# Patient Record
Sex: Male | Born: 1970 | Race: White | Hispanic: No | Marital: Married | Smoking: Current every day smoker
Health system: Southern US, Community
[De-identification: ages and names within clinical notes are randomized; demographics above are authoritative.]

## PROBLEM LIST (undated history)

## (undated) DIAGNOSIS — M5020 Other cervical disc displacement, unspecified cervical region: Secondary | ICD-10-CM

## (undated) HISTORY — PX: NECK SURGERY: SHX720

## (undated) SURGERY — Surgical Case
Anesthesia: *Unknown

---

## 2000-10-26 ENCOUNTER — Emergency Department (HOSPITAL_COMMUNITY): Admission: EM | Admit: 2000-10-26 | Discharge: 2000-10-26 | Payer: Self-pay | Admitting: *Deleted

## 2000-10-27 ENCOUNTER — Emergency Department (HOSPITAL_COMMUNITY): Admission: EM | Admit: 2000-10-27 | Discharge: 2000-10-27 | Payer: Self-pay | Admitting: Internal Medicine

## 2000-10-27 ENCOUNTER — Encounter: Payer: Self-pay | Admitting: Internal Medicine

## 2009-05-22 ENCOUNTER — Emergency Department (HOSPITAL_COMMUNITY): Admission: EM | Admit: 2009-05-22 | Discharge: 2009-05-22 | Payer: Self-pay | Admitting: Emergency Medicine

## 2012-01-23 ENCOUNTER — Inpatient Hospital Stay (HOSPITAL_COMMUNITY)
Admission: EM | Admit: 2012-01-23 | Discharge: 2012-01-29 | DRG: 603 | Disposition: A | Discharge status: Home / Self Care | Payer: Self-pay | Attending: Internal Medicine | Admitting: Internal Medicine

## 2012-01-23 ENCOUNTER — Emergency Department (HOSPITAL_COMMUNITY): Payer: Self-pay

## 2012-01-23 ENCOUNTER — Encounter (HOSPITAL_COMMUNITY): Payer: Self-pay | Admitting: Emergency Medicine

## 2012-01-23 DIAGNOSIS — L0291 Cutaneous abscess, unspecified: Secondary | ICD-10-CM | POA: Diagnosis present

## 2012-01-23 DIAGNOSIS — F17200 Nicotine dependence, unspecified, uncomplicated: Secondary | ICD-10-CM | POA: Diagnosis present

## 2012-01-23 DIAGNOSIS — E669 Obesity, unspecified: Secondary | ICD-10-CM | POA: Diagnosis present

## 2012-01-23 DIAGNOSIS — L03319 Cellulitis of trunk, unspecified: Principal | ICD-10-CM | POA: Diagnosis present

## 2012-01-23 DIAGNOSIS — L0390 Cellulitis, unspecified: Secondary | ICD-10-CM | POA: Diagnosis present

## 2012-01-23 DIAGNOSIS — L03314 Cellulitis of groin: Secondary | ICD-10-CM

## 2012-01-23 DIAGNOSIS — F411 Generalized anxiety disorder: Secondary | ICD-10-CM | POA: Diagnosis present

## 2012-01-23 DIAGNOSIS — G894 Chronic pain syndrome: Secondary | ICD-10-CM | POA: Diagnosis present

## 2012-01-23 DIAGNOSIS — F419 Anxiety disorder, unspecified: Secondary | ICD-10-CM | POA: Diagnosis present

## 2012-01-23 DIAGNOSIS — L02219 Cutaneous abscess of trunk, unspecified: Principal | ICD-10-CM | POA: Diagnosis present

## 2012-01-23 DIAGNOSIS — R52 Pain, unspecified: Secondary | ICD-10-CM | POA: Diagnosis present

## 2012-01-23 LAB — DIFFERENTIAL
Basophils Relative: 0 % (ref 0–1)
Eosinophils Absolute: 0.2 10*3/uL (ref 0.0–0.7)
Lymphs Abs: 1.8 10*3/uL (ref 0.7–4.0)
Neutro Abs: 7 10*3/uL (ref 1.7–7.7)
Neutrophils Relative %: 73 % (ref 43–77)

## 2012-01-23 LAB — CBC
Hemoglobin: 12.2 g/dL — ABNORMAL LOW (ref 13.0–17.0)
MCH: 32.1 pg (ref 26.0–34.0)
Platelets: 256 10*3/uL (ref 150–400)
RBC: 3.8 MIL/uL — ABNORMAL LOW (ref 4.22–5.81)

## 2012-01-23 LAB — URINALYSIS, ROUTINE W REFLEX MICROSCOPIC
Glucose, UA: NEGATIVE mg/dL
Leukocytes, UA: NEGATIVE
Protein, ur: NEGATIVE mg/dL
Specific Gravity, Urine: 1.008 (ref 1.005–1.030)
Urobilinogen, UA: 0.2 mg/dL (ref 0.0–1.0)

## 2012-01-23 LAB — POCT I-STAT, CHEM 8
Hemoglobin: 12.2 g/dL — ABNORMAL LOW (ref 13.0–17.0)
Potassium: 3.9 mEq/L (ref 3.5–5.1)
Sodium: 138 mEq/L (ref 135–145)
TCO2: 27 mmol/L (ref 0–100)

## 2012-01-23 MED ORDER — HYDROMORPHONE HCL PF 1 MG/ML IJ SOLN
1.0000 mg | Freq: Once | INTRAMUSCULAR | Status: AC
  Administered 2012-01-23: 1 mg via INTRAVENOUS
  Filled 2012-01-23: qty 1

## 2012-01-23 MED ORDER — VANCOMYCIN HCL IN DEXTROSE 1-5 GM/200ML-% IV SOLN
1000.0000 mg | Freq: Once | INTRAVENOUS | Status: AC
  Administered 2012-01-23: 1000 mg via INTRAVENOUS
  Filled 2012-01-23: qty 200

## 2012-01-23 MED ORDER — ONDANSETRON HCL 4 MG/2ML IJ SOLN
4.0000 mg | Freq: Once | INTRAMUSCULAR | Status: AC
  Administered 2012-01-23: 4 mg via INTRAVENOUS
  Filled 2012-01-23: qty 2

## 2012-01-23 MED ORDER — SODIUM CHLORIDE 0.9 % IV SOLN
Freq: Once | INTRAVENOUS | Status: AC
  Administered 2012-01-23: 22:00:00 via INTRAVENOUS

## 2012-01-23 NOTE — ED Notes (Signed)
The pt has had pain with swelling to his penis for 2-4 days ... Worse for 2 days

## 2012-01-23 NOTE — ED Notes (Addendum)
Pt c/o pain to groin area, started swelling 2 days ago.  St's area is red and hot.  Pt also st's he has had elevated temp.  Pt also st's he has a hardened area to right testicle.  Pt st's pain is radiating down right leg.

## 2012-01-23 NOTE — ED Notes (Signed)
Pt appears in pain.  Scrotal area red and marked. Vancomycin infiltrated to L hand.  Pharmacy notified and stated to place warm compresses on the site.

## 2012-01-23 NOTE — ED Provider Notes (Signed)
History     CSN: [id number]  Arrival date & time 01/23/12  1925   First MD Initiated Contact with Patient 01/23/12 2058      Chief Complaint  Patient presents with  . Groin Pain    (Consider location/radiation/quality/duration/timing/severity/associated sxs/prior treatment) HPI Comments: Patient presents today with right testicular pain and right groin.)  The pain has been going on for the past 3 days progressing rapidly.  Fever, myalgias, chest pain, chills, no dysuria, or frequency  Patient is a 41 y.o. male presenting with groin pain. The history is provided by the patient.  Groin Pain This is a new problem. The current episode started in the past 7 days. The problem occurs constantly. The problem has been gradually worsening. Associated symptoms include chest pain, chills, a fever, myalgias and weakness. Pertinent negatives include no urinary symptoms.  Groin Pain This is a new problem. The current episode started in the past 7 days. The problem occurs constantly. The problem has been gradually worsening. Associated symptoms include chest pain.    History reviewed. No pertinent past medical history.  Past Surgical History  Procedure Date  . Neck surgery     No family history on file.  History  Substance Use Topics  . Smoking status: Current Everyday Smoker  . Smokeless tobacco: Not on file  . Alcohol Use: Yes     occasional      Review of Systems  Constitutional: Positive for fever and chills.  Cardiovascular: Positive for chest pain.  Genitourinary: Positive for scrotal swelling and testicular pain. Negative for dysuria, frequency, decreased urine volume and penile swelling.  Musculoskeletal: Positive for myalgias.  Neurological: Positive for weakness.    Allergies  Flexeril and Robaxin  Home Medications   Current Outpatient Rx  Name Route Sig Dispense Refill  . GABAPENTIN 300 MG PO CAPS Oral Take 600 mg by mouth 2 (two) times daily.    . MOMETASONE  FUROATE 0.1 % EX CREA Topical Apply 1 application topically 3 (three) times daily.      BP 106/50  Pulse 84  Temp(Src) 98.4 F (36.9 C) (Oral)  Resp 20  SpO2 98%  Physical Exam  Constitutional: He is oriented to person, place, and time. He appears well-developed and well-nourished.  HENT:  Head: Normocephalic.  Cardiovascular: Normal rate.   Pulmonary/Chest: Effort normal.  Abdominal: Soft. Hernia confirmed negative in the right inguinal area.  Genitourinary:    Right testis shows swelling and tenderness. Circumcised.       Area of redness testicle tender   Neurological: He is alert and oriented to person, place, and time.  Skin: There is erythema.  Psychiatric: He has a normal mood and affect.    ED Course  Procedures (including critical care time)   Labs Reviewed  CBC  DIFFERENTIAL  URINALYSIS, ROUTINE W REFLEX MICROSCOPIC   Results for orders placed during the hospital encounter of 01/23/12  CBC      Component Value Range   WBC 9.6  4.0 - 10.5 (K/uL)   RBC 3.80 (*) 4.22 - 5.81 (MIL/uL)   Hemoglobin 12.2 (*) 13.0 - 17.0 (g/dL)   HCT 84.6 (*) 96.2 - 52.0 (%)   MCV 92.4  78.0 - 100.0 (fL)   MCH 32.1  26.0 - 34.0 (pg)   MCHC 34.8  30.0 - 36.0 (g/dL)   RDW 95.2  84.1 - 32.4 (%)   Platelets 256  150 - 400 (K/uL)  DIFFERENTIAL      Component Value Range  Neutrophils Relative 73  43 - 77 (%)   Neutro Abs 7.0  1.7 - 7.7 (K/uL)   Lymphocytes Relative 19  12 - 46 (%)   Lymphs Abs 1.8  0.7 - 4.0 (K/uL)   Monocytes Relative 6  3 - 12 (%)   Monocytes Absolute 0.6  0.1 - 1.0 (K/uL)   Eosinophils Relative 2  0 - 5 (%)   Eosinophils Absolute 0.2  0.0 - 0.7 (K/uL)   Basophils Relative 0  0 - 1 (%)   Basophils Absolute 0.0  0.0 - 0.1 (K/uL)  URINALYSIS, ROUTINE W REFLEX MICROSCOPIC      Component Value Range   Color, Urine YELLOW  YELLOW    APPearance CLEAR  CLEAR    Specific Gravity, Urine 1.008  1.005 - 1.030    pH 6.0  5.0 - 8.0    Glucose, UA NEGATIVE  NEGATIVE  (mg/dL)   Hgb urine dipstick NEGATIVE  NEGATIVE    Bilirubin Urine NEGATIVE  NEGATIVE    Ketones, ur NEGATIVE  NEGATIVE (mg/dL)   Protein, ur NEGATIVE  NEGATIVE (mg/dL)   Urobilinogen, UA 0.2  0.0 - 1.0 (mg/dL)   Nitrite NEGATIVE  NEGATIVE    Leukocytes, UA NEGATIVE  NEGATIVE   POCT I-STAT, CHEM 8      Component Value Range   Sodium 138  135 - 145 (mEq/L)   Potassium 3.9  3.5 - 5.1 (mEq/L)   Chloride 103  96 - 112 (mEq/L)   BUN 14  6 - 23 (mg/dL)   Creatinine, Ser 4.09  0.50 - 1.35 (mg/dL)   Glucose, Bld 99  70 - 99 (mg/dL)   Calcium, Ion 8.11  9.14 - 1.32 (mmol/L)   TCO2 27  0 - 100 (mmol/L)   Hemoglobin 12.2 (*) 13.0 - 17.0 (g/dL)   HCT 78.2 (*) 95.6 - 52.0 (%)   US Scrotum  01/23/2012  *RADIOLOGY REPORT*  Clinical Data: Right groin pain and discoloration for 1 week.  ULTRASOUND OF SCROTUM  Technique:  Complete ultrasound examination of the testicles, epididymis, and other scrotal structures was performed.  Comparison:  None.  Findings:  Right testis:  Measures 4.3 x 2.4 x 2.8 cm.  Normal homogeneous parenchymal echotexture without focal mass lesion.  Flow is demonstrated on color flow Doppler imaging.  Left testis:  Measures 4 x 2.5 x 2.7 cm.  Normal homogeneous parenchymal echotexture without focal mass lesion.  Normal flow is demonstrated on color flow Doppler imaging.  Right epididymis:  Normal in size and appearance.  Left epididymis:  Normal in size and appearance.  Hydrocele:  No evidence of hydrocele.  Varicocele:  No evidence of varicocele.  Lymphadenopathy is noted in the right groin with enlarged lymph node measuring up to 2.4 cm length.  IMPRESSION: Normal appearance of the testes.  Right groin lymphadenopathy.  Original Report Authenticated By: Marlon Pel, M.D.       1. Cellulitis of groin       MDM  Cellulitis versus for Fourneau's  gangrene  Medical screening examination/treatment/procedure(s) were conducted as a shared visit with non-physician  practitioner(s) and myself.  I personally evaluated the patient during the encounter 41 year old man with a cellulitis of the right groin, appx 10 cm in diameter, extending onto the right hemiscrotum, with tender adenopathy.  Rx with IV antibiotics.  Discussed with Dr. Conley Rolls --> admit to [facility], Dr. Benjamine Mola.    Osvaldo Human, M.D.     Arman Filter,  NP 01/23/12 2119  Carleene Cooper III, MD 01/24/12 [phone]

## 2012-01-23 NOTE — ED Notes (Signed)
Attempted to apply ice to area, pt refused

## 2012-01-23 NOTE — ED Notes (Signed)
To us now 

## 2012-01-24 ENCOUNTER — Encounter (HOSPITAL_COMMUNITY): Payer: Self-pay | Admitting: *Deleted

## 2012-01-24 DIAGNOSIS — L0291 Cutaneous abscess, unspecified: Secondary | ICD-10-CM | POA: Diagnosis present

## 2012-01-24 LAB — CBC
HCT: 33.4 % — ABNORMAL LOW (ref 39.0–52.0)
Hemoglobin: 11.4 g/dL — ABNORMAL LOW (ref 13.0–17.0)
MCH: 31.8 pg (ref 26.0–34.0)
MCH: 31.4 pg (ref 26.0–34.0)
MCHC: 34.7 g/dL (ref 30.0–36.0)
MCV: 91.5 fL (ref 78.0–100.0)
MCV: 92.6 fL (ref 78.0–100.0)
RBC: 3.63 MIL/uL — ABNORMAL LOW (ref 4.22–5.81)
RDW: 12.2 % (ref 11.5–15.5)

## 2012-01-24 LAB — CREATININE, SERUM: GFR calc Af Amer: 84 mL/min — ABNORMAL LOW (ref >90)

## 2012-01-24 LAB — BASIC METABOLIC PANEL
BUN: 13 mg/dL (ref 6–23)
CO2: 28 mEq/L (ref 19–32)
Glucose, Bld: 93 mg/dL (ref 70–99)
Potassium: 4.6 mEq/L (ref 3.5–5.1)
Sodium: 138 mEq/L (ref 135–145)

## 2012-01-24 MED ORDER — ONDANSETRON HCL 4 MG PO TABS: 4.0000 mg | ORAL_TABLET | Freq: Four times a day (QID) | ORAL | Status: DC | PRN

## 2012-01-24 MED ORDER — DEXTROSE 5 % IV SOLN
1.0000 g | INTRAVENOUS | Status: DC
  Filled 2012-01-24: qty 1

## 2012-01-24 MED ORDER — ONDANSETRON HCL 4 MG/2ML IJ SOLN: 4.0000 mg | Freq: Four times a day (QID) | INTRAMUSCULAR | Status: DC | PRN

## 2012-01-24 MED ORDER — DEXTROSE-NACL 5-0.9 % IV SOLN
INTRAVENOUS | Status: DC
  Administered 2012-01-24: 125 mL/h via INTRAVENOUS
  Administered 2012-01-24 – 2012-01-29 (×6): via INTRAVENOUS

## 2012-01-24 MED ORDER — VANCOMYCIN HCL IN DEXTROSE 1-5 GM/200ML-% IV SOLN
1000.0000 mg | Freq: Three times a day (TID) | INTRAVENOUS | Status: DC
  Administered 2012-01-24 – 2012-01-29 (×17): 1000 mg via INTRAVENOUS
  Filled 2012-01-24 (×18): qty 200

## 2012-01-24 MED ORDER — ACETAMINOPHEN 650 MG RE SUPP: 650.0000 mg | Freq: Four times a day (QID) | RECTAL | Status: DC | PRN

## 2012-01-24 MED ORDER — GABAPENTIN 300 MG PO CAPS
600.0000 mg | ORAL_CAPSULE | Freq: Two times a day (BID) | ORAL | Status: DC
  Administered 2012-01-24 – 2012-01-29 (×11): 600 mg via ORAL
  Filled 2012-01-24 (×12): qty 2

## 2012-01-24 MED ORDER — HYDROMORPHONE HCL PF 1 MG/ML IJ SOLN
1.0000 mg | INTRAMUSCULAR | Status: DC | PRN
  Administered 2012-01-24 (×2): 1 mg via INTRAVENOUS
  Filled 2012-01-24 (×2): qty 1

## 2012-01-24 MED ORDER — LORAZEPAM 1 MG PO TABS
1.0000 mg | ORAL_TABLET | Freq: Once | ORAL | Status: AC
  Administered 2012-01-24: 1 mg via ORAL
  Filled 2012-01-24: qty 1

## 2012-01-24 MED ORDER — ZOLPIDEM TARTRATE 10 MG PO TABS
10.0000 mg | ORAL_TABLET | Freq: Every evening | ORAL | Status: DC | PRN
  Administered 2012-01-24 – 2012-01-29 (×6): 10 mg via ORAL
  Filled 2012-01-24 (×2): qty 1
  Filled 2012-01-24: qty 2
  Filled 2012-01-24 (×3): qty 1

## 2012-01-24 MED ORDER — HYDROMORPHONE HCL PF 1 MG/ML IJ SOLN
2.0000 mg | INTRAMUSCULAR | Status: DC | PRN
  Administered 2012-01-24 – 2012-01-29 (×39): 2 mg via INTRAVENOUS
  Filled 2012-01-24 (×7): qty 2
  Filled 2012-01-24: qty 1
  Filled 2012-01-24 (×12): qty 2
  Filled 2012-01-24: qty 1
  Filled 2012-01-24 (×4): qty 2
  Filled 2012-01-24: qty 1
  Filled 2012-01-24 (×15): qty 2

## 2012-01-24 MED ORDER — PIPERACILLIN-TAZOBACTAM 3.375 G IVPB
3.3750 g | Freq: Once | INTRAVENOUS | Status: AC
  Administered 2012-01-24: 3.375 g via INTRAVENOUS
  Filled 2012-01-24: qty 50

## 2012-01-24 MED ORDER — KETOROLAC TROMETHAMINE 30 MG/ML IJ SOLN
30.0000 mg | Freq: Once | INTRAMUSCULAR | Status: AC
  Administered 2012-01-24: 30 mg via INTRAVENOUS
  Filled 2012-01-24: qty 1

## 2012-01-24 MED ORDER — ACETAMINOPHEN 325 MG PO TABS
650.0000 mg | ORAL_TABLET | Freq: Four times a day (QID) | ORAL | Status: DC | PRN
  Administered 2012-01-26 – 2012-01-28 (×4): 650 mg via ORAL
  Filled 2012-01-24 (×5): qty 2

## 2012-01-24 MED ORDER — PIPERACILLIN-TAZOBACTAM 3.375 G IVPB
3.3750 g | Freq: Three times a day (TID) | INTRAVENOUS | Status: DC
  Administered 2012-01-24 – 2012-01-29 (×17): 3.375 g via INTRAVENOUS
  Filled 2012-01-24 (×18): qty 50

## 2012-01-24 MED ORDER — HEPARIN SODIUM (PORCINE) 5000 UNIT/ML IJ SOLN
5000.0000 [IU] | Freq: Three times a day (TID) | INTRAMUSCULAR | Status: DC
  Administered 2012-01-24 – 2012-01-29 (×14): 5000 [IU] via SUBCUTANEOUS
  Filled 2012-01-24 (×19): qty 1

## 2012-01-24 MED ORDER — HYDROMORPHONE HCL PF 1 MG/ML IJ SOLN
1.0000 mg | Freq: Once | INTRAMUSCULAR | Status: AC
  Administered 2012-01-24: 1 mg via INTRAVENOUS
  Filled 2012-01-24: qty 1

## 2012-01-24 MED ORDER — DOCUSATE SODIUM 100 MG PO CAPS
100.0000 mg | ORAL_CAPSULE | Freq: Two times a day (BID) | ORAL | Status: DC
  Administered 2012-01-24 – 2012-01-29 (×11): 100 mg via ORAL
  Filled 2012-01-24 (×13): qty 1

## 2012-01-24 NOTE — Progress Notes (Signed)
Subjective: Patient c/o severe pain when he awoke from sleep. Thinks the swelling is slightly better.  + subjective fevers . States pain medicines are not working  Objective: Vital signs in last 24 hours: Filed Vitals:   01/24/12 0237 01/24/12 0309 01/24/12 0338 01/24/12 0548  BP: 134/64 107/25 115/64 127/81  Pulse: 75 84  63  Temp:  98.3 F (36.8 C)  98.2 F (36.8 C)  TempSrc:   Oral Oral  Resp: 18 18  18   Height:      Weight:   70.308 kg (155 lb)   SpO2: 99% 97%  93%   Weight change:  No intake or output data in the 24 hours ending 01/24/12 1055  Physical Exam: General: Awake, Oriented, No acute distress. HEENT: EOMI. Neck: Supple, no JVD CV: S1 and S2, RRR Lungs: Clear to ascultation bilaterally, no wheezing Abdomen: Soft, Nontender, Nondistended, +bowel sounds. Ext: Good pulses. Trace edema. Skin: erythema smaller in area that what was marked yesterday, R scrotum still swollen and tender, firm area in outer quadrant   Lab Results:  Basename 01/24/12 0600 01/24/12 0154 01/23/12 2130  NA 138 -- 138  K 4.6 -- 3.9  CL 105 -- 103  CO2 28 -- --  GLUCOSE 93 -- 99  BUN 13 -- 14  CREATININE 1.31 1.24 --  CALCIUM 9.0 -- --  MG -- -- --  PHOS -- -- --      Basename 01/24/12 0600 01/24/12 0154 01/23/12 2114  WBC 6.2 7.8 --  NEUTROABS -- -- 7.0  HGB 11.4* 11.6* --  HCT 33.6* 33.4* --  MCV 92.6 91.5 --  PLT 210 223 --          Micro Results: No results found for this or any previous visit (from the past 240 hour(s)).  Studies/Results: US Scrotum  01/23/2012  *RADIOLOGY REPORT*  Clinical Data: Right groin pain and discoloration for 1 week.  ULTRASOUND OF SCROTUM  Technique:  Complete ultrasound examination of the testicles, epididymis, and other scrotal structures was performed.  Comparison:  None.  Findings:  Right testis:  Measures 4.3 x 2.4 x 2.8 cm.  Normal homogeneous parenchymal echotexture without focal mass lesion.  Flow is demonstrated on color flow  Doppler imaging.  Left testis:  Measures 4 x 2.5 x 2.7 cm.  Normal homogeneous parenchymal echotexture without focal mass lesion.  Normal flow is demonstrated on color flow Doppler imaging.  Right epididymis:  Normal in size and appearance.  Left epididymis:  Normal in size and appearance.  Hydrocele:  No evidence of hydrocele.  Varicocele:  No evidence of varicocele.  Lymphadenopathy is noted in the right groin with enlarged lymph node measuring up to 2.4 cm length.  IMPRESSION: Normal appearance of the testes.  Right groin lymphadenopathy.  Original Report Authenticated By: Marlon Pel, M.D.    Medications: I have reviewed the patient's current medications. Scheduled Meds:   . sodium chloride   Intravenous Once  . docusate sodium  100 mg Oral BID  . gabapentin  600 mg Oral BID  . heparin  5,000 Units Subcutaneous Q8H  .  HYDROmorphone (DILAUDID) injection  1 mg Intravenous Once  .  HYDROmorphone (DILAUDID) injection  1 mg Intravenous Once  .  HYDROmorphone (DILAUDID) injection  1 mg Intravenous Once  . ketorolac  30 mg Intravenous Once  . LORazepam  1 mg Oral Once  . ondansetron  4 mg Intravenous Once  . ondansetron  4 mg Intravenous Once  . piperacillin-tazobactam (  ZOSYN)  IV  3.375 g Intravenous Once  . piperacillin-tazobactam (ZOSYN)  IV  3.375 g Intravenous Q8H  . vancomycin  1,000 mg Intravenous Once  . vancomycin  1,000 mg Intravenous Q8H  . DISCONTD: cefTAZidime (FORTAZ)  IV  1 g Intravenous To Major   Continuous Infusions:   . dextrose 5 % and 0.9% NaCl 125 mL/hr (01/24/12 0245)   PRN Meds:.acetaminophen, acetaminophen, HYDROmorphone, ondansetron (ZOFRAN) IV, ondansetron, zolpidem  Assessment/Plan: 1. R groin and scrotum Cellulitis with possible abscess- area is decreased with antibiotics, vanc/zosyn, consulted Dr. Brunilda Payor with Urology (11AM), no WBC count, ? I&D- no abscess on U/S 2. Pain- adjusted dilaudid 3. Heparin/colase    LOS: 1 day  VANN, JESSICA,  DO 01/24/2012, 10:55 AM

## 2012-01-24 NOTE — ED Notes (Signed)
Warm compress placed on L hand.

## 2012-01-24 NOTE — Progress Notes (Signed)
ANTIBIOTIC CONSULT NOTE - INITIAL  Pharmacy Consult for Vancomycin and Zosyn Indication: cellulitis  Allergies  Allergen Reactions  . Flexeril (Cyclobenzaprine Hcl) Rash    "lethargy"  . Robaxin (Methocarbamol) Rash    "lethargy"    Patient Measurements: Height: 5\' 10"  (177.8 cm) Weight: 160 lb (72.576 kg) IBW/kg (Calculated) : 73   Vital Signs: Temp: 98.4 F (36.9 C) (02/08 2324) Temp src: Oral (02/08 2324) BP: 149/107 mmHg (02/09 0030) Pulse Rate: 92  (02/09 0030)  Labs:  Basename 01/23/12 2130 01/23/12 2114  WBC -- 9.6  HGB 12.2* 12.2*  PLT -- 256  LABCREA -- --  CREATININE 1.20 --   Estimated Creatinine Clearance: 85.7 ml/min (by C-G formula based on Cr of 1.2).    Medical History: History reviewed. No pertinent past medical history.  Medications:  Neurontin  Elocon  Assessment: 41 yo male with right groin cellulitis for empiric antibiotics.  Vancomycin 1 g IV given in ED at 10 pm 2/8  Goal of Therapy:  Vancomycin trough level 10-15 mcg/ml  Plan:  Vancomycin 1 g IV q8h Zosyn 3.375 g IV q8h  Eddie Candle 01/24/2012,2:00 AM

## 2012-01-24 NOTE — Consult Note (Signed)
Urology Consult  Referring physician: Dr Marlin Canary Reason for referral: Pain right groin  Chief Complaint: Pain right groin  History of Present Illness: Mr Albert Evans was seen in the ER last night for a 1 week history of right groin pain and swelling.  For the past 3 days the pain gradually increased in intensity.  He has swelling and tenderness of the groin.  He does not have testicular pain.  He has hesitancy.  Urine is clear.  He does not have any changes in his bowel habits.He is on IV Zosyn and there is some improvement of his symptoms.   History reviewed. No pertinent past medical history. Past Surgical History  Procedure Date  . Neck surgery     Medications:Gabapentin, Elocon. Allergies:  Allergies  Allergen Reactions  . Fish-Derived Products Rash  . Flexeril (Cyclobenzaprine Hcl) Rash    "lethargy"  . Robaxin (Methocarbamol) Rash    "lethargy"    No family history on file. Social History:  reports that he has been smoking Cigarettes.  He has smoked for the past 5 years. He does not have any smokeless tobacco history on file. He reports that he drinks alcohol. He reports that he does not use illicit drugs.  ROS: All systems are reviewed and negative except as noted.   Physical Exam:  Vital signs in last 24 hours: Temp:  [98.2 F (36.8 C)-98.4 F (36.9 C)] 98.2 F (36.8 C) (02/09 0548) Pulse Rate:  [63-96] 63  (02/09 0548) Resp:  [18-20] 18  (02/09 0548) BP: (106-149)/(25-107) 127/81 mmHg (02/09 0548) SpO2:  [93 %-99 %] 93 % (02/09 0548) Weight:  [155 lb (70.308 kg)-160 lb (72.576 kg)] 155 lb (70.308 kg) (02/09 1610)  Cardiovascular: Skin warm; not flushed Respiratory: Breaths quiet; no shortness of breath Abdomen: No masses Neurological: Normal sensation to touch Musculoskeletal: Normal motor function arms and legs Lymphatics: No inguinal adenopathy Skin: No rashes Genitourinary:Penis is circumcised.  Meatus is normal.  The testicles are normal.  There is no  scrotal tenderness.  The right groin is reddened, indurated  and tender.  There is no fluctuation.  Laboratory Data:  Results for orders placed during the hospital encounter of 01/23/12 (from the past 72 hour(s))  CBC     Status: Abnormal   Collection Time   01/23/12  9:14 PM      Component Value Range Comment   WBC 9.6  4.0 - 10.5 (K/uL)    RBC 3.80 (*) 4.22 - 5.81 (MIL/uL)    Hemoglobin 12.2 (*) 13.0 - 17.0 (g/dL)    HCT 96.0 (*) 45.4 - 52.0 (%)    MCV 92.4  78.0 - 100.0 (fL)    MCH 32.1  26.0 - 34.0 (pg)    MCHC 34.8  30.0 - 36.0 (g/dL)    RDW 09.8  11.9 - 14.7 (%)    Platelets 256  150 - 400 (K/uL)   DIFFERENTIAL     Status: Normal   Collection Time   01/23/12  9:14 PM      Component Value Range Comment   Neutrophils Relative 73  43 - 77 (%)    Neutro Abs 7.0  1.7 - 7.7 (K/uL)    Lymphocytes Relative 19  12 - 46 (%)    Lymphs Abs 1.8  0.7 - 4.0 (K/uL)    Monocytes Relative 6  3 - 12 (%)    Monocytes Absolute 0.6  0.1 - 1.0 (K/uL)    Eosinophils Relative 2  0 - 5 (%)  Eosinophils Absolute 0.2  0.0 - 0.7 (K/uL)    Basophils Relative 0  0 - 1 (%)    Basophils Absolute 0.0  0.0 - 0.1 (K/uL)   POCT I-STAT, CHEM 8     Status: Abnormal   Collection Time   01/23/12  9:30 PM      Component Value Range Comment   Sodium 138  135 - 145 (mEq/L)    Potassium 3.9  3.5 - 5.1 (mEq/L)    Chloride 103  96 - 112 (mEq/L)    BUN 14  6 - 23 (mg/dL)    Creatinine, Ser 8.65  0.50 - 1.35 (mg/dL)    Glucose, Bld 99  70 - 99 (mg/dL)    Calcium, Ion 7.84  1.12 - 1.32 (mmol/L)    TCO2 27  0 - 100 (mmol/L)    Hemoglobin 12.2 (*) 13.0 - 17.0 (g/dL)    HCT 69.6 (*) 29.5 - 52.0 (%)   URINALYSIS, ROUTINE W REFLEX MICROSCOPIC     Status: Normal   Collection Time   01/23/12 10:02 PM      Component Value Range Comment   Color, Urine YELLOW  YELLOW     APPearance CLEAR  CLEAR     Specific Gravity, Urine 1.008  1.005 - 1.030     pH 6.0  5.0 - 8.0     Glucose, UA NEGATIVE  NEGATIVE (mg/dL)    Hgb urine  dipstick NEGATIVE  NEGATIVE     Bilirubin Urine NEGATIVE  NEGATIVE     Ketones, ur NEGATIVE  NEGATIVE (mg/dL)    Protein, ur NEGATIVE  NEGATIVE (mg/dL)    Urobilinogen, UA 0.2  0.0 - 1.0 (mg/dL)    Nitrite NEGATIVE  NEGATIVE     Leukocytes, UA NEGATIVE  NEGATIVE  MICROSCOPIC NOT DONE ON URINES WITH NEGATIVE PROTEIN, BLOOD, LEUKOCYTES, NITRITE, OR GLUCOSE <1000 mg/dL.  CBC     Status: Abnormal   Collection Time   01/24/12  1:54 AM      Component Value Range Comment   WBC 7.8  4.0 - 10.5 (K/uL)    RBC 3.65 (*) 4.22 - 5.81 (MIL/uL)    Hemoglobin 11.6 (*) 13.0 - 17.0 (g/dL)    HCT 28.4 (*) 13.2 - 52.0 (%)    MCV 91.5  78.0 - 100.0 (fL)    MCH 31.8  26.0 - 34.0 (pg)    MCHC 34.7  30.0 - 36.0 (g/dL)    RDW 44.0  10.2 - 72.5 (%)    Platelets 223  150 - 400 (K/uL)   CREATININE, SERUM     Status: Abnormal   Collection Time   01/24/12  1:54 AM      Component Value Range Comment   Creatinine, Ser 1.24  0.50 - 1.35 (mg/dL)    GFR calc non Af Amer 72 (*) >90 (mL/min)    GFR calc Af Amer 84 (*) >90 (mL/min)   BASIC METABOLIC PANEL     Status: Abnormal   Collection Time   01/24/12  6:00 AM      Component Value Range Comment   Sodium 138  135 - 145 (mEq/L)    Potassium 4.6  3.5 - 5.1 (mEq/L)    Chloride 105  96 - 112 (mEq/L)    CO2 28  19 - 32 (mEq/L)    Glucose, Bld 93  70 - 99 (mg/dL)    BUN 13  6 - 23 (mg/dL)    Creatinine, Ser 3.66  0.50 - 1.35 (  mg/dL)    Calcium 9.0  8.4 - 10.5 (mg/dL)    GFR calc non Af Amer 68 (*) >90 (mL/min)    GFR calc Af Amer 78 (*) >90 (mL/min)   CBC     Status: Abnormal   Collection Time   01/24/12  6:00 AM      Component Value Range Comment   WBC 6.2  4.0 - 10.5 (K/uL)    RBC 3.63 (*) 4.22 - 5.81 (MIL/uL)    Hemoglobin 11.4 (*) 13.0 - 17.0 (g/dL)    HCT 16.1 (*) 09.6 - 52.0 (%)    MCV 92.6  78.0 - 100.0 (fL)    MCH 31.4  26.0 - 34.0 (pg)    MCHC 33.9  30.0 - 36.0 (g/dL)    RDW 04.5  40.9 - 81.1 (%)    Platelets 210  150 - 400 (K/uL)    No results found  for this or any previous visit (from the past 240 hour(s)). Creatinine:  Basename 01/24/12 0600 01/24/12 0154 01/23/12 2130  CREATININE 1.31 1.24 1.20    Xrays:  Scrotal ultrasound revealed normal testicles, right inguinal adenopathy with enlarged lymph node.     Impression/Assessment:  Cellulitis right groin  Plan:  Continue IV Zosyn.  Monitor for development of abscess. If there is fluctuation he would then need incision and drainage.  Tyshana Nishida-HENRY 01/24/2012, 12:48 PM

## 2012-01-24 NOTE — H&P (Signed)
PCP:   No primary provider on file.   Chief Complaint: Groin infection   HPI: Albert Evans is an 41 y.o. male with history of neck pain, status post prior neck surgery, on chronic pain medication, presents to the emergency room with erythema, swelling, and pain of his right groin and right upper thigh. This has been going on for a week. In the past he had similar symptoms and had an I&D of this area. He denied any fever or chills, nausea vomiting, dysuria, or any abdominal pain. Evaluation in the emergency room included a normal white count, normal renal function, and a normal urinalysis. Testicle ultrasound showed no focal abscess, but there is lymphadenopathy. Hospitalist was asked to admit patient for IV antibiotics. He constantly requesting more pain medication.  Rewiew of Systems:  The patient denies anorexia, fever, weight loss,, vision loss, decreased hearing, hoarseness, chest pain, syncope, dyspnea on exertion, peripheral edema, balance deficits, hemoptysis, abdominal pain, melena, hematochezia, severe indigestion/heartburn, hematuria, incontinence, genital sores, muscle weakness, suspicious skin lesions, transient blindness, difficulty walking, depression, unusual weight change, abnormal bleeding, enlarged lymph nodes, angioedema, and breast masses.    History reviewed. No pertinent past medical history.  Past Surgical History  Procedure Date  . Neck surgery     Medications:  HOME MEDS: Prior to Admission medications   Medication Sig Start Date End Date Taking? Authorizing Provider  gabapentin (NEURONTIN) 300 MG capsule Take 600 mg by mouth 2 (two) times daily.   Yes Historical Provider, MD  mometasone (ELOCON) 0.1 % cream Apply 1 application topically 3 (three) times daily.   Yes Historical Provider, MD     Allergies:  Allergies  Allergen Reactions  . Fish-Derived Products Rash  . Flexeril (Cyclobenzaprine Hcl) Rash    "lethargy"  . Robaxin (Methocarbamol) Rash   "lethargy"    Social History:   reports that he has been smoking Cigarettes.  He has smoked for the past 5 years. He does not have any smokeless tobacco history on file. He reports that he drinks alcohol. He reports that he does not use illicit drugs.  Family History: No family history on file.   Physical Exam: Filed Vitals:   01/24/12 0202 01/24/12 0237 01/24/12 0309 01/24/12 0338  BP:  134/64 107/25 115/64  Pulse:  75 84   Temp:   98.3 F (36.8 C)   TempSrc:    Oral  Resp:  18 18   Height: 5\' 10"  (1.778 m)     Weight: 72.576 kg (160 lb)   70.308 kg (155 lb)  SpO2:  99% 97%    Blood pressure 115/64, pulse 84, temperature 98.3 F (36.8 C), temperature source Oral, resp. rate 18, height 5\' 10"  (1.778 m), weight 70.308 kg (155 lb), SpO2 97.00%.  GEN:  person lying in the stretcher in no acute distress; cooperative with exam. PSYCH:  alert and oriented x4; does not appear anxious does not appear depressed; affect is normal HEENT: Mucous membranes pink and anicteric; PERRLA; EOM intact; no cervical lymphadenopathy nor thyromegaly or carotid bruit; no JVD; there is anterior approach surgical scar from prior neck surgery Breasts:: Not examined CHEST WALL: No tenderness CHEST: Normal respiration, clear to auscultation bilaterally HEART: Regular rate and rhythm; no murmurs rubs or gallops BACK: No kyphosis or scoliosis; no CVA tenderness ABDOMEN: Obese, soft non-tender; no masses, no organomegaly, normal abdominal bowel sounds; no pannus; no intertriginous candida. Rectal Exam: Not done EXTREMITIES: No bone or joint deformity; age-appropriate arthropathy of the hands and  knees; no edema; no ulcerations. Genitalia: There is swelling of the right scrotal, with erythema spreading toward the right upper thigh and groin. There is in duration and tenderness along the inguinal canal. Because of scrotum is so tender, I was not able to examine his testicle itself. PULSES: 2+ and symmetric SKIN:  Normal hydration no rash or ulceration CNS: Cranial nerves 2-12 grossly intact no focal neurologic deficit   Labs & Imaging Results for orders placed during the hospital encounter of 01/23/12 (from the past 48 hour(s))  CBC     Status: Abnormal   Collection Time   01/23/12  9:14 PM      Component Value Range Comment   WBC 9.6  4.0 - 10.5 (K/uL)    RBC 3.80 (*) 4.22 - 5.81 (MIL/uL)    Hemoglobin 12.2 (*) 13.0 - 17.0 (g/dL)    HCT 16.1 (*) 09.6 - 52.0 (%)    MCV 92.4  78.0 - 100.0 (fL)    MCH 32.1  26.0 - 34.0 (pg)    MCHC 34.8  30.0 - 36.0 (g/dL)    RDW 04.5  40.9 - 81.1 (%)    Platelets 256  150 - 400 (K/uL)   DIFFERENTIAL     Status: Normal   Collection Time   01/23/12  9:14 PM      Component Value Range Comment   Neutrophils Relative 73  43 - 77 (%)    Neutro Abs 7.0  1.7 - 7.7 (K/uL)    Lymphocytes Relative 19  12 - 46 (%)    Lymphs Abs 1.8  0.7 - 4.0 (K/uL)    Monocytes Relative 6  3 - 12 (%)    Monocytes Absolute 0.6  0.1 - 1.0 (K/uL)    Eosinophils Relative 2  0 - 5 (%)    Eosinophils Absolute 0.2  0.0 - 0.7 (K/uL)    Basophils Relative 0  0 - 1 (%)    Basophils Absolute 0.0  0.0 - 0.1 (K/uL)   POCT I-STAT, CHEM 8     Status: Abnormal   Collection Time   01/23/12  9:30 PM      Component Value Range Comment   Sodium 138  135 - 145 (mEq/L)    Potassium 3.9  3.5 - 5.1 (mEq/L)    Chloride 103  96 - 112 (mEq/L)    BUN 14  6 - 23 (mg/dL)    Creatinine, Ser 9.14  0.50 - 1.35 (mg/dL)    Glucose, Bld 99  70 - 99 (mg/dL)    Calcium, Ion 7.82  1.12 - 1.32 (mmol/L)    TCO2 27  0 - 100 (mmol/L)    Hemoglobin 12.2 (*) 13.0 - 17.0 (g/dL)    HCT 95.6 (*) 21.3 - 52.0 (%)   URINALYSIS, ROUTINE W REFLEX MICROSCOPIC     Status: Normal   Collection Time   01/23/12 10:02 PM      Component Value Range Comment   Color, Urine YELLOW  YELLOW     APPearance CLEAR  CLEAR     Specific Gravity, Urine 1.008  1.005 - 1.030     pH 6.0  5.0 - 8.0     Glucose, UA NEGATIVE  NEGATIVE (mg/dL)    Hgb  urine dipstick NEGATIVE  NEGATIVE     Bilirubin Urine NEGATIVE  NEGATIVE     Ketones, ur NEGATIVE  NEGATIVE (mg/dL)    Protein, ur NEGATIVE  NEGATIVE (mg/dL)    Urobilinogen, UA 0.2  0.0 -  1.0 (mg/dL)    Nitrite NEGATIVE  NEGATIVE     Leukocytes, UA NEGATIVE  NEGATIVE  MICROSCOPIC NOT DONE ON URINES WITH NEGATIVE PROTEIN, BLOOD, LEUKOCYTES, NITRITE, OR GLUCOSE <1000 mg/dL.  CBC     Status: Abnormal   Collection Time   01/24/12  1:54 AM      Component Value Range Comment   WBC 7.8  4.0 - 10.5 (K/uL)    RBC 3.65 (*) 4.22 - 5.81 (MIL/uL)    Hemoglobin 11.6 (*) 13.0 - 17.0 (g/dL)    HCT 29.5 (*) 62.1 - 52.0 (%)    MCV 91.5  78.0 - 100.0 (fL)    MCH 31.8  26.0 - 34.0 (pg)    MCHC 34.7  30.0 - 36.0 (g/dL)    RDW 30.8  65.7 - 84.6 (%)    Platelets 223  150 - 400 (K/uL)   CREATININE, SERUM     Status: Abnormal   Collection Time   01/24/12  1:54 AM      Component Value Range Comment   Creatinine, Ser 1.24  0.50 - 1.35 (mg/dL)    GFR calc non Af Amer 72 (*) >90 (mL/min)    GFR calc Af Amer 84 (*) >90 (mL/min)    US Scrotum  01/23/2012  *RADIOLOGY REPORT*  Clinical Data: Right groin pain and discoloration for 1 week.  ULTRASOUND OF SCROTUM  Technique:  Complete ultrasound examination of the testicles, epididymis, and other scrotal structures was performed.  Comparison:  None.  Findings:  Right testis:  Measures 4.3 x 2.4 x 2.8 cm.  Normal homogeneous parenchymal echotexture without focal mass lesion.  Flow is demonstrated on color flow Doppler imaging.  Left testis:  Measures 4 x 2.5 x 2.7 cm.  Normal homogeneous parenchymal echotexture without focal mass lesion.  Normal flow is demonstrated on color flow Doppler imaging.  Right epididymis:  Normal in size and appearance.  Left epididymis:  Normal in size and appearance.  Hydrocele:  No evidence of hydrocele.  Varicocele:  No evidence of varicocele.  Lymphadenopathy is noted in the right groin with enlarged lymph node measuring up to 2.4 cm length.   IMPRESSION: Normal appearance of the testes.  Right groin lymphadenopathy.  Original Report Authenticated By: Marlon Pel, M.D.      Assessment Present on Admission:  .Cellulitis and abscess Chronic pain syndrome  PLAN:  Will start him on vancomycin and Zosyn. We'll give him pain medication along with IV fluid. I will make him n.p.o. in case he needs I&D. Please consult urologist tomorrow. He is otherwise stable, full code, and will be admitted to [facility] hospitalist service.   Other plans as per orders.    Vora Clover 01/24/2012, 5:39 AM

## 2012-01-24 NOTE — ED Notes (Signed)
PT appears anxious and agitated.  Denies any illicit drug use.  States he smokes only 4 cigarettes a day and does not want a nicotine patch.  Pt requesting to speak to Nurse Practitioner.

## 2012-01-25 ENCOUNTER — Inpatient Hospital Stay (HOSPITAL_COMMUNITY): Payer: Self-pay | Admitting: Certified Registered Nurse Anesthetist

## 2012-01-25 ENCOUNTER — Encounter (HOSPITAL_COMMUNITY)
Admission: EM | Disposition: A | Discharge status: Home / Self Care | Payer: Self-pay | Source: Home / Self Care | Attending: Internal Medicine

## 2012-01-25 ENCOUNTER — Encounter (HOSPITAL_COMMUNITY): Payer: Self-pay | Admitting: Certified Registered Nurse Anesthetist

## 2012-01-25 HISTORY — PX: IRRIGATION AND DEBRIDEMENT ABSCESS: SHX5252

## 2012-01-25 SURGERY — IRRIGATION AND DEBRIDEMENT ABSCESS
Anesthesia: General | Site: Scrotum | Wound class: Dirty or Infected

## 2012-01-25 MED ORDER — PROPOFOL 10 MG/ML IV BOLUS
INTRAVENOUS | Status: DC | PRN
  Administered 2012-01-25: 200 mg via INTRAVENOUS

## 2012-01-25 MED ORDER — PROMETHAZINE HCL 25 MG/ML IJ SOLN: 6.2500 mg | INTRAMUSCULAR | Status: DC | PRN

## 2012-01-25 MED ORDER — 0.9 % SODIUM CHLORIDE (POUR BTL) OPTIME
TOPICAL | Status: DC | PRN
  Administered 2012-01-25: 1000 mL

## 2012-01-25 MED ORDER — MIDAZOLAM HCL 5 MG/5ML IJ SOLN
INTRAMUSCULAR | Status: DC | PRN
  Administered 2012-01-25: 2 mg via INTRAVENOUS

## 2012-01-25 MED ORDER — LACTATED RINGERS IV SOLN
INTRAVENOUS | Status: DC | PRN
  Administered 2012-01-25: 14:00:00 via INTRAVENOUS

## 2012-01-25 MED ORDER — ONDANSETRON HCL 4 MG/2ML IJ SOLN
INTRAMUSCULAR | Status: DC | PRN
  Administered 2012-01-25: 4 mg via INTRAVENOUS

## 2012-01-25 MED ORDER — CLONAZEPAM 0.5 MG PO TABS
0.2500 mg | ORAL_TABLET | Freq: Two times a day (BID) | ORAL | Status: DC | PRN
  Administered 2012-01-25 (×2): 0.25 mg via ORAL
  Filled 2012-01-25 (×2): qty 1

## 2012-01-25 MED ORDER — HYDROMORPHONE HCL PF 1 MG/ML IJ SOLN
0.2500 mg | INTRAMUSCULAR | Status: DC | PRN
  Administered 2012-01-25 (×4): 0.5 mg via INTRAVENOUS

## 2012-01-25 MED ORDER — HYDROMORPHONE HCL PF 1 MG/ML IJ SOLN
INTRAMUSCULAR | Status: AC
  Administered 2012-01-25: 0.5 mg via INTRAVENOUS
  Filled 2012-01-25: qty 1

## 2012-01-25 MED ORDER — MEPERIDINE HCL 25 MG/ML IJ SOLN: 6.2500 mg | INTRAMUSCULAR | Status: DC | PRN

## 2012-01-25 MED ORDER — FENTANYL CITRATE 0.05 MG/ML IJ SOLN
INTRAMUSCULAR | Status: DC | PRN
  Administered 2012-01-25 (×5): 50 ug via INTRAVENOUS

## 2012-01-25 SURGICAL SUPPLY — 19 items
BLADE SURG 15 STRL LF DISP TIS (BLADE) IMPLANT
BLADE SURG 15 STRL SS (BLADE) ×2
COVER SURGICAL LIGHT HANDLE (MISCELLANEOUS) ×1 IMPLANT
DRAPE LAPAROTOMY 100X72X124 (DRAPES) ×1 IMPLANT
ELECT REM PT RETURN 9FT ADLT (ELECTROSURGICAL) ×2
ELECTRODE REM PT RTRN 9FT ADLT (ELECTROSURGICAL) IMPLANT
GAUZE PACKING IODOFORM 1/4X5 (PACKING) ×1 IMPLANT
GAUZE SPONGE 4X4 12PLY STRL LF (GAUZE/BANDAGES/DRESSINGS) ×1 IMPLANT
GLOVE BIO SURGEON STRL SZ 6.5 (GLOVE) ×1 IMPLANT
GLOVE BIO SURGEON STRL SZ7 (GLOVE) ×1 IMPLANT
GOWN BRE IMP SLV AUR LG STRL (GOWN DISPOSABLE) ×1 IMPLANT
GOWN BRE IMP SLV AUR XL STRL (GOWN DISPOSABLE) ×1 IMPLANT
MANIFOLD NEPTUNE WASTE (CANNULA) ×1 IMPLANT
PACK GENERAL/GYN (CUSTOM PROCEDURE TRAY) ×1 IMPLANT
SOLUTION BETADINE 4OZ (MISCELLANEOUS) ×1 IMPLANT
SPONGE GAUZE 4X4 12PLY (GAUZE/BANDAGES/DRESSINGS) ×1 IMPLANT
SWAB COLLECTION DEVICE MRSA (MISCELLANEOUS) ×2 IMPLANT
TOWEL OR 17X26 10 PK STRL BLUE (TOWEL DISPOSABLE) ×1 IMPLANT
TUBE ANAEROBIC SPECIMEN COL (MISCELLANEOUS) ×2 IMPLANT

## 2012-01-25 NOTE — Transfer of Care (Signed)
Immediate Anesthesia Transfer of Care Note  Patient: Albert Evans  Procedure(s) Performed:  IRRIGATION AND DEBRIDEMENT ABSCESS  Patient Location: PACU  Anesthesia Type: General  Level of Consciousness: awake, alert  and oriented  Airway & Oxygen Therapy: Patient Spontanous Breathing and Patient connected to nasal cannula oxygen  Post-op Assessment: Report given to PACU RN and Post -op Vital signs reviewed and stable  Post vital signs: Reviewed and stable  Complications: No apparent anesthesia complications
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
## 2012-01-25 NOTE — Anesthesia Postprocedure Evaluation (Signed)
  Anesthesia Post-op Note  Patient: Albert Evans  Procedure(s) Performed:  IRRIGATION AND DEBRIDEMENT ABSCESS  Patient Location: PACU  Anesthesia Type: General  Level of Consciousness: awake, alert  and oriented  Airway and Oxygen Therapy: Patient Spontanous Breathing  Post-op Pain: mild  Post-op Assessment: Post-op Vital signs reviewed, Patient's Cardiovascular Status Stable, Respiratory Function Stable, Patent Airway and No signs of Nausea or vomiting  Post-op Vital Signs: Reviewed and stable  Complications: No apparent anesthesia complications

## 2012-01-25 NOTE — Progress Notes (Signed)
  Subjective: Patient reports Still complaining of severe right groin pain.  Objective: Vital signs in last 24 hours: Temp:  [97.9 F (36.6 C)-98.2 F (36.8 C)] 98 F (36.7 C) (02/10 0615) Pulse Rate:  [60-76] 76  (02/10 0615) Resp:  [16-20] 16  (02/10 0615) BP: (124-137)/(61-80) 137/78 mmHg (02/10 0615) SpO2:  [91 %-100 %] 99 % (02/10 0615)  Intake/Output from previous day: 02/09 0701 - 02/10 0700 In: 0  Out: 300 [Urine:300] Intake/Output this shift:    Physical Exam:  Right upper scrotum is still red and tender.  There is some fluctuant area of the upper scrotum. The testicles are normal.        Lab Results:  Basename 01/24/12 0600 01/24/12 0154 01/23/12 2130  HGB 11.4* 11.6* 12.2*  HCT 33.6* 33.4* 36.0*   BMET  Basename 01/24/12 0600 01/24/12 0154 01/23/12 2130  NA 138 -- 138  K 4.6 -- 3.9  CL 105 -- 103  CO2 28 -- --  GLUCOSE 93 -- 99  BUN 13 -- 14  CREATININE 1.31 1.24 --  CALCIUM 9.0 -- --   No results found for this basename: LABPT:3,INR:3 in the last 72 hours No results found for this basename: LABURIN:1 in the last 72 hours No results found for this or any previous visit.  Studies/Results: US Scrotum  01/23/2012  *RADIOLOGY REPORT*  Clinical Data: Right groin pain and discoloration for 1 week.  ULTRASOUND OF SCROTUM  Technique:  Complete ultrasound examination of the testicles, epididymis, and other scrotal structures was performed.  Comparison:  None.  Findings:  Right testis:  Measures 4.3 x 2.4 x 2.8 cm.  Normal homogeneous parenchymal echotexture without focal mass lesion.  Flow is demonstrated on color flow Doppler imaging.  Left testis:  Measures 4 x 2.5 x 2.7 cm.  Normal homogeneous parenchymal echotexture without focal mass lesion.  Normal flow is demonstrated on color flow Doppler imaging.  Right epididymis:  Normal in size and appearance.  Left epididymis:  Normal in size and appearance.  Hydrocele:  No evidence of hydrocele.  Varicocele:  No  evidence of varicocele.  Lymphadenopathy is noted in the right groin with enlarged lymph node measuring up to 2.4 cm length.  IMPRESSION: Normal appearance of the testes.  Right groin lymphadenopathy.  Original Report Authenticated By: Marlon Pel, M.D.    Assessment/Plan: Scrotal abscess  Needs I& D abscess.  The procedure, risks, benefits were explained to the patient. The risks include but are not limited to hemorrhage, skin loss, extensive wound debridement. He understands and wishes to proceed.  LOS: 2 days   Carianne Taira-HENRY 01/25/2012, 10:31 AM

## 2012-01-25 NOTE — Preoperative (Signed)
Beta Blockers   Reason not to administer Beta Blockers:Not Applicable 

## 2012-01-25 NOTE — Op Note (Addendum)
Albert Evans is a 41 y.o.   01/25/2012  Pre Op diagnosis: Right groin and scrotal abscess.  Post Op Diagnosis: Same  Procedure done: Incision and drainage of abscess. Wound culture.  Surgeon: Wendie Simmer. Breindy Meadow  Anesthesia: Gen.  Indication: Patient is a 41 years old male who has been complaining of right groin and right scrotal pain for about a week. The pain was associated with some swelling and redness of the skin. The pain gradually worsened and the swelling had increased in size. He came to the emergency room 2 days ago with the symptoms. Scrotal ultrasound showed right inguinal adenopathy, and no true abscess. Patient was treated with IV Zosyn and vancomycin. However, he continued to complain of severe pain. On examination today the right scrotum showed an area of fluctuation. He was then advised to have incision and drainage of the scrotum and the right groin abscess. He  is agreeable  Procedure: Patient was identified by his wrist band and timeout was performed.  Under general anesthesia patient was prepped and draped and placed in the supine position. A longitudinal incision was made over the fluctuant area of the right scrotum. There was a small amount of purulent drainage out of the incision. A hemostat was then passed through the incision and a small abscess cavity was found. It was expanded with a hemostat. Wound culture was obtained.  Then another incision parallel to the right groin crease was made over the fluctuant area of the right groin. There is a small amount of drainage from the incision site. A hemostat was passed through the incision to expand the the abscess cavity.  The wounds were then packed with quarter inch iodoform gauze.  EBL: Minimal.  Needles, sponges count: Correct.  The patient tolerated the procedure well and left the OR in satisfactory condition to postanesthesia care unit.

## 2012-01-25 NOTE — Anesthesia Procedure Notes (Signed)
Procedure Name: LMA Insertion Date/Time: 01/25/2012 2:30 PM Performed by: Delbert Harness Pre-anesthesia Checklist: Patient identified, Timeout performed, Emergency Drugs available, Suction available and Patient being monitored Patient Re-evaluated:Patient Re-evaluated prior to inductionOxygen Delivery Method: Circle System Utilized Preoxygenation: Pre-oxygenation with 100% oxygen Intubation Type: IV induction LMA: LMA inserted LMA Size: 4.0 Number of attempts: 1 Placement Confirmation: positive ETCO2 and breath sounds checked- equal and bilateral Tube secured with: Tape Dental Injury: Teeth and Oropharynx as per pre-operative assessment

## 2012-01-25 NOTE — Progress Notes (Signed)
. Subjective: C/o anxiety and pain but pain meds are working, no fevers- states he thinks he has an abscess in his testicle  Objective: Vital signs in last 24 hours: Filed Vitals:   01/24/12 1459 01/24/12 2108 01/25/12 0500 01/25/12 0615  BP: 137/80 126/70 124/61 137/78  Pulse: 60 62 60 76  Temp: 98.2 F (36.8 C) 97.9 F (36.6 C) 97.9 F (36.6 C) 98 F (36.7 C)  TempSrc: Oral     Resp: 20 20 20 16   Height:      Weight:      SpO2: 100% 93% 91% 99%   Weight change:   Intake/Output Summary (Last 24 hours) at 01/25/12 0859 Last data filed at 01/25/12 0500  Gross per 24 hour  Intake      0 ml  Output    300 ml  Net   -300 ml    Physical Exam: General: Awake, Oriented, No acute distress. HEENT: EOMI. Neck: Supple, no JVD CV: S1 and S2, RRR Lungs: Clear to ascultation bilaterally, no wheezing Abdomen: Soft, Nontender, Nondistended, +bowel sounds. Ext: Good pulses. Trace edema. Skin: erythema smaller in area that what was marked yesterday, R scrotum still swollen and tender, firm area in outer quadrant   Lab Results:  Basename 01/24/12 0600 01/24/12 0154 01/23/12 2130  NA 138 -- 138  K 4.6 -- 3.9  CL 105 -- 103  CO2 28 -- --  GLUCOSE 93 -- 99  BUN 13 -- 14  CREATININE 1.31 1.24 --  CALCIUM 9.0 -- --  MG -- -- --  PHOS -- -- --      Basename 01/24/12 0600 01/24/12 0154 01/23/12 2114  WBC 6.2 7.8 --  NEUTROABS -- -- 7.0  HGB 11.4* 11.6* --  HCT 33.6* 33.4* --  MCV 92.6 91.5 --  PLT 210 223 --     Studies/Results: US Scrotum  01/23/2012  *RADIOLOGY REPORT*  Clinical Data: Right groin pain and discoloration for 1 week.  ULTRASOUND OF SCROTUM  Technique:  Complete ultrasound examination of the testicles, epididymis, and other scrotal structures was performed.  Comparison:  None.  Findings:  Right testis:  Measures 4.3 x 2.4 x 2.8 cm.  Normal homogeneous parenchymal echotexture without focal mass lesion.  Flow is demonstrated on color flow Doppler imaging.   Left testis:  Measures 4 x 2.5 x 2.7 cm.  Normal homogeneous parenchymal echotexture without focal mass lesion.  Normal flow is demonstrated on color flow Doppler imaging.  Right epididymis:  Normal in size and appearance.  Left epididymis:  Normal in size and appearance.  Hydrocele:  No evidence of hydrocele.  Varicocele:  No evidence of varicocele.  Lymphadenopathy is noted in the right groin with enlarged lymph node measuring up to 2.4 cm length.  IMPRESSION: Normal appearance of the testes.  Right groin lymphadenopathy.  Original Report Authenticated By: Marlon Pel, M.D.    Medications: I have reviewed the patient's current medications. Scheduled Meds:    . docusate sodium  100 mg Oral BID  . gabapentin  600 mg Oral BID  . heparin  5,000 Units Subcutaneous Q8H  . piperacillin-tazobactam (ZOSYN)  IV  3.375 g Intravenous Q8H  . vancomycin  1,000 mg Intravenous Q8H   Continuous Infusions:    . dextrose 5 % and 0.9% NaCl 125 mL/hr at 01/24/12 1955   PRN Meds:.acetaminophen, acetaminophen, clonazePAM, HYDROmorphone, ondansetron (ZOFRAN) IV, ondansetron, zolpidem, DISCONTD: HYDROmorphone, DISCONTD: HYDROmorphone  Assessment/Plan: 1. R groin and scrotum Cellulitis with possible abscess- area  is decreased with antibiotics, vanc/zosyn, appreciate urology following, no WBC count, ? I&D- no abscess on U/S 2. Pain- adjusted dilaudid 3. Heparin/colase 4. Anxiety- prn klonopin   LOS: 2 days  Audie Wieser, DO 01/25/2012, 8:59 AM

## 2012-01-26 ENCOUNTER — Encounter (HOSPITAL_COMMUNITY): Payer: Self-pay | Admitting: Urology

## 2012-01-26 DIAGNOSIS — R52 Pain, unspecified: Secondary | ICD-10-CM | POA: Diagnosis present

## 2012-01-26 DIAGNOSIS — F419 Anxiety disorder, unspecified: Secondary | ICD-10-CM | POA: Diagnosis present

## 2012-01-26 MED ORDER — CLONAZEPAM 0.5 MG PO TABS
0.5000 mg | ORAL_TABLET | Freq: Two times a day (BID) | ORAL | Status: DC | PRN
  Administered 2012-01-26 – 2012-01-28 (×6): 0.5 mg via ORAL
  Filled 2012-01-26 (×6): qty 1

## 2012-01-26 NOTE — Progress Notes (Signed)
   CARE MANAGEMENT NOTE 01/26/2012  Patient:  Albert Evans, Albert Evans   Account Number:  [id number]  Date Initiated:  01/26/2012  Documentation initiated by:  Donn Pierini  Subjective/Objective Assessment:   Pt admitted with cellulitis and abscess     Action/Plan:   PTA pt lived at home and was independent with ADLs   Anticipated DC Date:  01/29/2012   Anticipated DC Plan:  HOME/SELF CARE      DC Planning Services  CM consult      Choice offered to / List presented to:             Status of service:  In process, will continue to follow Medicare Important Message given?   (If response is "NO", the following Medicare IM given date fields will be blank) Date Medicare IM given:   Date Additional Medicare IM given:    Discharge Disposition:    Per UR Regulation:    Comments:  PCP- [facility]  01/26/12- 1200- Donn Pierini RN, BSN [phone] Spoke with pt at bedside- per conversation pt states that he has been staying with Uncle here in town for last several weeks but usually stay with girlfriend in [place]. Pt reports that he has applied for disability- and usually goes to the [facility] for his medical needs and to get medications (tramadol and Neurontin). He also goes to [facility] in [place]. Per pharmacy pt is eligible for medication assistance with the indigent fund if needed at discharge. CM to follow for d/c planning/needs.

## 2012-01-26 NOTE — Progress Notes (Signed)
Utilization review complete 

## 2012-01-26 NOTE — Progress Notes (Signed)
Subjective: Patient reports Still complaining of pain..  Objective: Vital signs in last 24 hours: Temp:  [98.6 F (37 C)-100.6 F (38.1 C)] 100.3 F (37.9 C) (02/11 1253) Pulse Rate:  [64-95] 92  (02/11 1253) Resp:  [18] 18  (02/11 1253) BP: (131-141)/(73-82) 131/80 mmHg (02/11 1253) SpO2:  [96 %-97 %] 96 % (02/11 1253)  Intake/Output from previous day: 02/10 0701 - 02/11 0700 In: 700 [I.V.:700] Out: 1105 [Urine:1100; Blood:5] Intake/Output this shift: Total I/O In: 1077 [P.O.:1077] Out: 1075 [Urine:1075]  Physical Exam:   Right groin less erythematous.  Right scrotum tender and firm. Packing groin and scrotum advanced. Remove packing in AM  if there is minimal wound drainage. Lab Results:  Basename 01/24/12 0600 01/24/12 0154 01/23/12 2130  HGB 11.4* 11.6* 12.2*  HCT 33.6* 33.4* 36.0*   BMET  Basename 01/24/12 0600 01/24/12 0154 01/23/12 2130  NA 138 -- 138  K 4.6 -- 3.9  CL 105 -- 103  CO2 28 -- --  GLUCOSE 93 -- 99  BUN 13 -- 14  CREATININE 1.31 1.24 --  CALCIUM 9.0 -- --   Results for orders placed during the hospital encounter of 01/23/12  WOUND CULTURE     Status: Normal (Preliminary result)   Collection Time   01/25/12  2:50 PM      Component Value Range Status Comment   Specimen Description WOUND RIGHT GROIN   Final    Special Requests SPECIMEN A, PT ON VANCO AND ZOSYN   Final    Gram Stain PENDING   Incomplete    Culture NO GROWTH   Final    Report Status PENDING   Incomplete   ANAEROBIC CULTURE     Status: Normal (Preliminary result)   Collection Time   01/25/12  2:50 PM      Component Value Range Status Comment   Specimen Description WOUND RIGHT GROIN   Final    Special Requests SPECIMEN A, PT ON VANCO AND ZOSYN   Final    Gram Stain PENDING   Incomplete    Culture     Final    Value: NO ANAEROBES ISOLATED; CULTURE IN PROGRESS FOR 5 DAYS   Report Status PENDING   Incomplete   AFB CULTURE WITH SMEAR     Status: Normal (Preliminary result)   Collection Time   01/25/12  2:50 PM      Component Value Range Status Comment   Specimen Description WOUND RIGHT GROIN   Final    Special Requests NONE   Final    ACID FAST SMEAR NO ACID FAST BACILLI SEEN   Final    Culture     Final    Value: CULTURE WILL BE EXAMINED FOR 6 WEEKS BEFORE ISSUING A FINAL REPORT   Report Status PENDING   Incomplete   WOUND CULTURE     Status: Normal (Preliminary result)   Collection Time   01/25/12  2:53 PM      Component Value Range Status Comment   Specimen Description WOUND RIGHT GROIN   Final    Special Requests SPECIMEN B, PT ON VANCO AND ZOSYN   Final    Gram Stain PENDING   Incomplete    Culture NO GROWTH 1 DAY   Final    Report Status PENDING   Incomplete   ANAEROBIC CULTURE     Status: Normal (Preliminary result)   Collection Time   01/25/12  2:53 PM      Component Value Range Status Comment  Specimen Description WOUND RIGHT GROIN   Final    Special Requests SPECIMEN B, PT ON VANCO AND ZOSYN   Final    Gram Stain PENDING   Incomplete    Culture     Final    Value: NO ANAEROBES ISOLATED; CULTURE IN PROGRESS FOR 5 DAYS   Report Status PENDING   Incomplete   AFB CULTURE WITH SMEAR     Status: Normal (Preliminary result)   Collection Time   01/25/12  2:53 PM      Component Value Range Status Comment   Specimen Description WOUND RIGHT GROIN   Final    Special Requests NONE   Final    ACID FAST SMEAR NO ACID FAST BACILLI SEEN   Final    Culture     Final    Value: CULTURE WILL BE EXAMINED FOR 6 WEEKS BEFORE ISSUING A FINAL REPORT   Report Status PENDING   Incomplete      Assessment/Plan:  S/P I & D scrotal and groin abscess. Continue Zosyn and Vancomycin.    LOS: 3 days   Breyon Sigg-HENRY 01/26/2012, 6:29 PM

## 2012-01-26 NOTE — Progress Notes (Signed)
.   Subjective: S/p I&D, still c/o pain, anxiety, and subjective fevers  Objective: Vital signs in last 24 hours: Filed Vitals:   01/25/12 1545 01/25/12 1600 01/25/12 2001 01/26/12 0555  BP: 135/60 112/55 139/73 141/82  Pulse: 65 61 64 95  Temp: 98.4 F (36.9 C) 97.9 F (36.6 C) 98.6 F (37 C) 100.6 F (38.1 C)  TempSrc:   Oral Oral  Resp: 16 16 18 18   Height:      Weight:      SpO2: 97% 96% 97% 96%   Weight change:   Intake/Output Summary (Last 24 hours) at 01/26/12 0833 Last data filed at 01/26/12 0800  Gross per 24 hour  Intake    700 ml  Output   1430 ml  Net   -730 ml    Physical Exam: General: Awake, Oriented, No acute distress. HEENT: EOMI. Neck: Supple, no JVD CV: S1 and S2, RRR Lungs: Clear to ascultation bilaterally, no wheezing Abdomen: Soft, Nontender, Nondistended, +bowel sounds. Ext: Good pulses. Trace edema. Skin: erythema smaller in area that what was marked yesterday, R scrotum still swollen and tender, firm area in outer quadrant, packing material in incision   Lab Results:  Basename 01/24/12 0600 01/24/12 0154 01/23/12 2130  NA 138 -- 138  K 4.6 -- 3.9  CL 105 -- 103  CO2 28 -- --  GLUCOSE 93 -- 99  BUN 13 -- 14  CREATININE 1.31 1.24 --  CALCIUM 9.0 -- --  MG -- -- --  PHOS -- -- --      Basename 01/24/12 0600 01/24/12 0154 01/23/12 2114  WBC 6.2 7.8 --  NEUTROABS -- -- 7.0  HGB 11.4* 11.6* --  HCT 33.6* 33.4* --  MCV 92.6 91.5 --  PLT 210 223 --     Studies/Results: No results found.  Medications: I have reviewed the patient's current medications. Scheduled Meds:    . docusate sodium  100 mg Oral BID  . gabapentin  600 mg Oral BID  . heparin  5,000 Units Subcutaneous Q8H  . piperacillin-tazobactam (ZOSYN)  IV  3.375 g Intravenous Q8H  . vancomycin  1,000 mg Intravenous Q8H   Continuous Infusions:    . dextrose 5 % and 0.9% NaCl 125 mL/hr at 01/26/12 0446   PRN Meds:.acetaminophen, acetaminophen, clonazePAM,  HYDROmorphone, HYDROmorphone, meperidine, ondansetron (ZOFRAN) IV, ondansetron, promethazine, zolpidem, DISCONTD: 0.9 % irrigation (POUR BTL)  Assessment/Plan: 1. R groin and scrotum Cellulitis with possible abscess- s;/p I&D, erthymatic area is decreased with antibiotics, vanc/zosyn day #3, appreciate urology following, no WBC count- wound culture sent, will follow 2. Pain- adjusted dilaudid 3. Heparin/colase 4. Anxiety- prn klonopin, will increase   LOS: 3 days  Nailah Luepke, DO 01/26/2012, 8:33 AM

## 2012-01-27 ENCOUNTER — Inpatient Hospital Stay (HOSPITAL_COMMUNITY): Payer: Self-pay

## 2012-01-27 LAB — VANCOMYCIN, TROUGH: Vancomycin Tr: 18.3 ug/mL (ref 10.0–20.0)

## 2012-01-27 MED ORDER — FAMOTIDINE 20 MG PO TABS
20.0000 mg | ORAL_TABLET | Freq: Every day | ORAL | Status: DC
  Administered 2012-01-27 – 2012-01-29 (×3): 20 mg via ORAL
  Filled 2012-01-27 (×4): qty 1

## 2012-01-27 NOTE — Progress Notes (Signed)
Subjective: Patient reports severe right scrotal and groin pain.  T: 101.8  Objective: Vital signs in last 24 hours: Temp:  [100.1 F (37.8 C)-101.8 F (38.8 C)] 100.1 F (37.8 C) (02/12 0543) Pulse Rate:  [92-98] 98  (02/12 0543) Resp:  [18-20] 20  (02/12 0543) BP: (127-135)/(58-80) 135/67 mmHg (02/12 0543) SpO2:  [92 %-96 %] 92 % (02/12 0543)  Intake/Output from previous day: 02/11 0701 - 02/12 0700 In: 4577 [P.O.:1077; I.V.:3000; IV Piggyback:500] Out: 2075 [Urine:2075] Intake/Output this shift:    Physical Exam:  Genitalia:  Right scrotum swollen, tender.  Tender right inguinal adenopathy.                   Right upper thigh less reddened. Minimal wound drainage.   Results for orders placed during the hospital encounter of 01/23/12  WOUND CULTURE     Status: Normal (Preliminary result)   Collection Time   01/25/12  2:50 PM      Component Value Range Status Comment   Specimen Description WOUND RIGHT GROIN   Final    Special Requests SPECIMEN A, PT ON VANCO AND ZOSYN   Final    Gram Stain     Final    Value: NO WBC SEEN     NO SQUAMOUS EPITHELIAL CELLS SEEN     NO ORGANISMS SEEN   Culture NO GROWTH 2 DAYS   Final    Report Status PENDING   Incomplete   ANAEROBIC CULTURE     Status: Normal (Preliminary result)   Collection Time   01/25/12  2:50 PM      Component Value Range Status Comment   Specimen Description WOUND RIGHT GROIN   Final    Special Requests SPECIMEN A, PT ON VANCO AND ZOSYN   Final    Gram Stain     Final    Value: NO WBC SEEN     NO SQUAMOUS EPITHELIAL CELLS SEEN     NO ORGANISMS SEEN   Culture     Final    Value: NO ANAEROBES ISOLATED; CULTURE IN PROGRESS FOR 5 DAYS   Report Status PENDING   Incomplete   AFB CULTURE WITH SMEAR     Status: Normal (Preliminary result)   Collection Time   01/25/12  2:50 PM      Component Value Range Status Comment   Specimen Description WOUND RIGHT GROIN   Final    Special Requests NONE   Final    ACID FAST  SMEAR NO ACID FAST BACILLI SEEN   Final    Culture     Final    Value: CULTURE WILL BE EXAMINED FOR 6 WEEKS BEFORE ISSUING A FINAL REPORT   Report Status PENDING   Incomplete   WOUND CULTURE     Status: Normal (Preliminary result)   Collection Time   01/25/12  2:53 PM      Component Value Range Status Comment   Specimen Description WOUND RIGHT GROIN   Final    Special Requests SPECIMEN B, PT ON VANCO AND ZOSYN   Final    Gram Stain     Final    Value: NO WBC SEEN     NO SQUAMOUS EPITHELIAL CELLS SEEN     NO ORGANISMS SEEN   Culture NO GROWTH 2 DAYS   Final    Report Status PENDING   Incomplete   ANAEROBIC CULTURE     Status: Normal (Preliminary result)   Collection Time   01/25/12  2:53  PM      Component Value Range Status Comment   Specimen Description WOUND RIGHT GROIN   Final    Special Requests SPECIMEN B, PT ON VANCO AND ZOSYN   Final    Gram Stain     Final    Value: NO WBC SEEN     NO SQUAMOUS EPITHELIAL CELLS SEEN     NO ORGANISMS SEEN   Culture     Final    Value: NO ANAEROBES ISOLATED; CULTURE IN PROGRESS FOR 5 DAYS   Report Status PENDING   Incomplete   AFB CULTURE WITH SMEAR     Status: Normal (Preliminary result)   Collection Time   01/25/12  2:53 PM      Component Value Range Status Comment   Specimen Description WOUND RIGHT GROIN   Final    Special Requests NONE   Final    ACID FAST SMEAR NO ACID FAST BACILLI SEEN   Final    Culture     Final    Value: CULTURE WILL BE EXAMINED FOR 6 WEEKS BEFORE ISSUING A FINAL REPORT   Report Status PENDING   Incomplete     Studies/Results: Wound culture: no growth Assessment/Plan:  Right scrotal/groin abscess.  Remove packing (done).  Repeat scrotal ultrasound   LOS: 4 days   Albert Evans 01/27/2012, 8:41 AM

## 2012-01-27 NOTE — Progress Notes (Signed)
  Subjective: S/p I&D, still c/o pain, anxiety, and subjective fevers  Objective: Vital signs in last 24 hours: Filed Vitals:   01/26/12 0555 01/26/12 1253 01/26/12 2131 01/27/12 0543  BP: 141/82 131/80 127/58 135/67  Pulse: 95 92 97 98  Temp: 100.6 F (38.1 C) 100.3 F (37.9 C) 101.8 F (38.8 C) 100.1 F (37.8 C)  TempSrc: Oral Oral  Oral  Resp: 18 18 20 20   Height:      Weight:      SpO2: 96% 96% 94% 92%   Weight change:   Intake/Output Summary (Last 24 hours) at 01/27/12 1053 Last data filed at 01/27/12 0600  Gross per 24 hour  Intake   4097 ml  Output   1750 ml  Net   2347 ml    Physical Exam: General: Awake, Oriented, No acute distress. HEENT: EOMI. Neck: Supple, no JVD CV: S1 and S2, RRR Lungs: Clear to ascultation bilaterally, no wheezing Abdomen: Soft, Nontender, Nondistended, +bowel sounds. Ext: Good pulses. Trace edema. Skin: erythema smaller in area that what was marked yesterday, R scrotum still swollen and tender, firm area in outer quadrant, packing material in incision   Lab Results: No results found for this basename: NA:2,K:2,CL:2,CO2:2,GLUCOSE:2,BUN:2,CREATININE:2,CALCIUM:2,MG:2,PHOS:2 in the last 72 hours   No results found for this basename: WBC:2,NEUTROABS:2,HGB:2,HCT:2,MCV:2,PLT:2 in the last 72 hours   Studies/Results: US Scrotum  01/27/2012  *RADIOLOGY REPORT*  Clinical Data: 41 year old male status post right groin I&D.  ULTRASOUND OF SCROTUM  Technique:  Complete ultrasound examination of the testicles, epididymis, and other scrotal structures was performed.  Comparison:  01/23/2012.  Findings:  Right testis:  Stable, normal echotexture.  4.6 x 2.4 x 3.0 cm. 1 or 2 punctate scrotal calcifications again noted, significance doubtful.  Left testis:  Stable, normal echotexture.  4.5 x 2.3 x 2.3 cm.  Right epididymis:  Normal in size and appearance.  Left epididymis:  Normal in size and appearance.  Hydrocele:  Trace bilateral greater on the  right.  Varicocele:  Absent.  Other findings:  Preserved Doppler flow in both testes on brief color Doppler interrogation.  Scrotal wall thickening and edema. Right groin soft tissue edema.  No superficial fluid collection. The right inguinal node seen on the previous study is not identified today.  IMPRESSION: Scrotal and groin soft tissue edema.  Stable and normal sonographic appearance of the testes.  Original Report Authenticated By: Harley Hallmark, M.D.    Medications: I have reviewed the patient's current medications. Scheduled Meds:    . docusate sodium  100 mg Oral BID  . gabapentin  600 mg Oral BID  . heparin  5,000 Units Subcutaneous Q8H  . piperacillin-tazobactam (ZOSYN)  IV  3.375 g Intravenous Q8H  . vancomycin  1,000 mg Intravenous Q8H   Continuous Infusions:    . dextrose 5 % and 0.9% NaCl 125 mL/hr at 01/26/12 0446   PRN Meds:.acetaminophen, acetaminophen, clonazePAM, HYDROmorphone, HYDROmorphone, meperidine, ondansetron (ZOFRAN) IV, ondansetron, promethazine, zolpidem  Assessment/Plan: 1. R groin and scrotum Cellulitis with possible abscess- s;/p I&D, erthymatic area is decreased with antibiotics, vanc/zosyn day #4, appreciate urology following, no WBC count- wound culture sent- NGTD, repeat U/S ok 2. Pain- adjusted dilaudid 3. Heparin/colase 4. Anxiety- prn klonopin, will increase   LOS: 4 days  Robson Trickey, DO 01/27/2012, 10:53 AM

## 2012-01-27 NOTE — Progress Notes (Signed)
ANTIBIOTIC CONSULT NOTE - Follow Up  Pharmacy Consult for Vancomycin and Zosyn Indication: cellulitis  Allergies  Allergen Reactions  . Fish-Derived Products Rash  . Flexeril (Cyclobenzaprine Hcl) Rash    "lethargy"  . Robaxin (Methocarbamol) Rash    "lethargy"    Patient Measurements: Height: 5\' 10"  (177.8 cm) Weight: 155 lb (70.308 kg) IBW/kg (Calculated) : 73   Vital Signs: Temp: 100.1 F (37.8 C) (02/12 0543) Temp src: Oral (02/12 0543) BP: 135/67 mmHg (02/12 0543) Pulse Rate: 98  (02/12 0543)  Labs: Vancomycin trough = 18.3 mcg/ml @ 06:30 01/27/12  Assessment: 41 yo male with right scrotal/groin abscess/cellulitis on empiric Vancomycin /zosyn day# 4.  Scr 1.31 on 2/9.  CrCl ~ 76 ml/min. I/O 4577/2075. Tc 100.1, Tm 101.8  Wound culture NGTD. Blood cultures ordered today. Vancomycin trough this AM = 18.3 mcg/ml. Goal = 10-67mcg/ml for cellulitis.  Uncertain how accurate trough reflects current dose as time trough collected was 06:30 and time this AM dose of Vancomycin given is 05:58. According to these times vancomycin infusing while trough drawn. As trough of 18.3 mcg/ml is below max goal range of 15-53mcg/ml, I will leave on current vancomycin 1000mg  IV q8hr and recheck vancomycin level in a few days.  Goal of Therapy:  Vancomycin trough level 10-15 mcg/ml (max 15-22mcg/ml)  Plan:  Continue Vancomycin 1000 mg IV q8hr and Zosyn 3.375 g IV q8h.  Arman Filter, [facility] 01/27/2012,9:01 AM

## 2012-01-28 LAB — WOUND CULTURE
Culture: NO GROWTH
Culture: NO GROWTH
Gram Stain: NONE SEEN

## 2012-01-28 NOTE — Progress Notes (Signed)
Subjective: Patient reports.  Still has right scrotal and groin pain, but less.  He also has less swelling. Repeat scrotal ultrasound shows swelling of the scrotum, no abscess.  The inguinal  adenopathy is no longer present.  Objective: Vital signs in last 24 hours: Temp:  [98.8 F (37.1 C)-101.3 F (38.5 C)] 99.2 F (37.3 C) (02/13 0810) Pulse Rate:  [77-103] 96  (02/13 0810) Resp:  [20] 20  (02/13 0810) BP: (128-169)/(56-77) 128/56 mmHg (02/13 0810) SpO2:  [95 %-99 %] 95 % (02/13 0810)  Intake/Output from previous day: 02/12 0701 - 02/13 0700 In: 2935 [P.O.:1560; I.V.:1125; IV Piggyback:250] Out: 1550 [Urine:1550]   Physical Exam:  General: Feels sleepy this morning. Scrotum much less swollen and tender.   Right groin is less reddened and less tender.  The swelling has decreased.   Results for orders placed during the hospital encounter of 01/23/12  WOUND CULTURE     Status: Normal   Collection Time   01/25/12  2:50 PM      Component Value Range Status Comment   Specimen Description WOUND RIGHT GROIN   Final    Special Requests SPECIMEN A, PT ON VANCO AND ZOSYN   Final    Gram Stain     Final    Value: NO WBC SEEN     NO SQUAMOUS EPITHELIAL CELLS SEEN     NO ORGANISMS SEEN   Culture NO GROWTH 2 DAYS   Final    Report Status 01/28/2012 FINAL   Final   ANAEROBIC CULTURE     Status: Normal (Preliminary result)   Collection Time   01/25/12  2:50 PM      Component Value Range Status Comment   Specimen Description WOUND RIGHT GROIN   Final    Special Requests SPECIMEN A, PT ON VANCO AND ZOSYN   Final    Gram Stain     Final    Value: NO WBC SEEN     NO SQUAMOUS EPITHELIAL CELLS SEEN     NO ORGANISMS SEEN   Culture     Final    Value: NO ANAEROBES ISOLATED; CULTURE IN PROGRESS FOR 5 DAYS   Report Status PENDING   Incomplete   AFB CULTURE WITH SMEAR     Status: Normal (Preliminary result)   Collection Time   01/25/12  2:50 PM      Component Value Range Status Comment     Specimen Description WOUND RIGHT GROIN   Final    Special Requests NONE   Final    ACID FAST SMEAR NO ACID FAST BACILLI SEEN   Final    Culture     Final    Value: CULTURE WILL BE EXAMINED FOR 6 WEEKS BEFORE ISSUING A FINAL REPORT   Report Status PENDING   Incomplete   WOUND CULTURE     Status: Normal   Collection Time   01/25/12  2:53 PM      Component Value Range Status Comment   Specimen Description WOUND RIGHT GROIN   Final    Special Requests SPECIMEN B, PT ON VANCO AND ZOSYN   Final    Gram Stain     Final    Value: NO WBC SEEN     NO SQUAMOUS EPITHELIAL CELLS SEEN     NO ORGANISMS SEEN   Culture NO GROWTH 2 DAYS   Final    Report Status 01/28/2012 FINAL   Final   ANAEROBIC CULTURE     Status: Normal (Preliminary  result)   Collection Time   01/25/12  2:53 PM      Component Value Range Status Comment   Specimen Description WOUND RIGHT GROIN   Final    Special Requests SPECIMEN B, PT ON VANCO AND ZOSYN   Final    Gram Stain     Final    Value: NO WBC SEEN     NO SQUAMOUS EPITHELIAL CELLS SEEN     NO ORGANISMS SEEN   Culture     Final    Value: NO ANAEROBES ISOLATED; CULTURE IN PROGRESS FOR 5 DAYS   Report Status PENDING   Incomplete   AFB CULTURE WITH SMEAR     Status: Normal (Preliminary result)   Collection Time   01/25/12  2:53 PM      Component Value Range Status Comment   Specimen Description WOUND RIGHT GROIN   Final    Special Requests NONE   Final    ACID FAST SMEAR NO ACID FAST BACILLI SEEN   Final    Culture     Final    Value: CULTURE WILL BE EXAMINED FOR 6 WEEKS BEFORE ISSUING A FINAL REPORT   Report Status PENDING   Incomplete   CULTURE, BLOOD (ROUTINE X 2)     Status: Normal (Preliminary result)   Collection Time   01/27/12  8:15 AM      Component Value Range Status Comment   Specimen Description BLOOD RIGHT ARM   Final    Special Requests BOTTLES DRAWN AEROBIC AND ANAEROBIC 10CC   Final    Culture  Setup Time [id number]   Final    Culture     Final     Value:        BLOOD CULTURE RECEIVED NO GROWTH TO DATE CULTURE WILL BE HELD FOR 5 DAYS BEFORE ISSUING A FINAL NEGATIVE REPORT   Report Status PENDING   Incomplete   CULTURE, BLOOD (ROUTINE X 2)     Status: Normal (Preliminary result)   Collection Time   01/27/12  8:30 AM      Component Value Range Status Comment   Specimen Description BLOOD RIGHT HAND   Final    Special Requests     Final    Value: BOTTLES DRAWN AEROBIC AND ANAEROBIC 10CC BLUE 5CC RED   Culture  Setup Time [id number]   Final    Culture     Final    Value:        BLOOD CULTURE RECEIVED NO GROWTH TO DATE CULTURE WILL BE HELD FOR 5 DAYS BEFORE ISSUING A FINAL NEGATIVE REPORT   Report Status PENDING   Incomplete     Studies/Results: US Scrotum  01/27/2012  *RADIOLOGY REPORT*  Clinical Data: 41 year old male status post right groin I&D.  ULTRASOUND OF SCROTUM  Technique:  Complete ultrasound examination of the testicles, epididymis, and other scrotal structures was performed.  Comparison:  01/23/2012.  Findings:  Right testis:  Stable, normal echotexture.  4.6 x 2.4 x 3.0 cm. 1 or 2 punctate scrotal calcifications again noted, significance doubtful.  Left testis:  Stable, normal echotexture.  4.5 x 2.3 x 2.3 cm.  Right epididymis:  Normal in size and appearance.  Left epididymis:  Normal in size and appearance.  Hydrocele:  Trace bilateral greater on the right.  Varicocele:  Absent.  Other findings:  Preserved Doppler flow in both testes on brief color Doppler interrogation.  Scrotal wall thickening and edema. Right groin soft tissue edema.  No superficial fluid  collection. The right inguinal node seen on the previous study is not identified today.  IMPRESSION: Scrotal and groin soft tissue edema.  Stable and normal sonographic appearance of the testes.  Original Report Authenticated By: Harley Hallmark, M.D.    Assessment/Plan:  Continue wet to dry dressing   LOS: 5 days   Shaunae Sieloff-HENRY 01/28/2012, 12:20 PM

## 2012-01-28 NOTE — Progress Notes (Signed)
   CARE MANAGEMENT NOTE 01/28/2012  Patient:  Albert Evans, Albert Evans   Account Number:  [id number]  Date Initiated:  01/26/2012  Documentation initiated by:  Donn Pierini  Subjective/Objective Assessment:   Pt admitted with cellulitis and abscess     Action/Plan:   PTA pt lived at home and was independent with ADLs   Anticipated DC Date:  01/30/2012   Anticipated DC Plan:  HOME/SELF CARE      DC Planning Services  CM consult      Choice offered to / List presented to:             Status of service:  In process, will continue to follow Medicare Important Message given?   (If response is "NO", the following Medicare IM given date fields will be blank) Date Medicare IM given:   Date Additional Medicare IM given:    Discharge Disposition:    Per UR Regulation:    Comments:  [facility]- [facility]  01/28/12 16:45 Letha Cape RN, BSN [phone] Patient still on IV ABX, per MD will get ID consult for recs.  01/26/12- 1200- Donn Pierini RN, BSN [phone] Spoke with pt at bedside- per conversation pt states that he has been staying with Uncle here in town for last several weeks but usually stay with girlfriend in [place]. Pt reports that he has applied for disability- and usually goes to the [facility] for his medical needs and to get medications (tramadol and Neurontin). He also goes to [facility] in [place]. Per pharmacy pt is eligible for medication assistance with the indigent fund if needed at discharge. CM to follow for d/c planning/needs.

## 2012-01-29 LAB — BASIC METABOLIC PANEL
BUN: 13 mg/dL (ref 6–23)
Chloride: 103 mEq/L (ref 96–112)
GFR calc Af Amer: 88 mL/min — ABNORMAL LOW (ref >90)
GFR calc non Af Amer: 76 mL/min — ABNORMAL LOW (ref >90)
Potassium: 3.7 mEq/L (ref 3.5–5.1)
Sodium: 137 mEq/L (ref 135–145)

## 2012-01-29 MED ORDER — CLINDAMYCIN HCL 300 MG PO CAPS: 300.0000 mg | ORAL_CAPSULE | Freq: Three times a day (TID) | ORAL | Status: AC

## 2012-01-29 MED ORDER — HYDROMORPHONE HCL 2 MG PO TABS
4.0000 mg | ORAL_TABLET | ORAL | Status: DC | PRN
  Administered 2012-01-29: 4 mg via ORAL
  Filled 2012-01-29: qty 2

## 2012-01-29 MED ORDER — GABAPENTIN 300 MG PO CAPS: 600.0000 mg | ORAL_CAPSULE | Freq: Three times a day (TID) | ORAL | Status: AC

## 2012-01-29 MED ORDER — CLONAZEPAM 1 MG PO TABS: 1.0000 mg | ORAL_TABLET | Freq: Two times a day (BID) | ORAL | Status: AC | PRN

## 2012-01-29 MED ORDER — DOXYCYCLINE HYCLATE 100 MG PO TABS: 100.0000 mg | ORAL_TABLET | Freq: Two times a day (BID) | ORAL | Status: AC

## 2012-01-29 MED ORDER — HYDROMORPHONE HCL 4 MG PO TABS: 4.0000 mg | ORAL_TABLET | ORAL | Status: AC | PRN

## 2012-01-29 MED ORDER — CHLORHEXIDINE GLUCONATE 4 % EX LIQD: 60.0000 mL | Freq: Every day | CUTANEOUS | Status: AC | PRN

## 2012-01-29 NOTE — Progress Notes (Signed)
   CARE MANAGEMENT NOTE 01/29/2012  Patient:  Albert Evans, Albert Evans   Account Number:  [id number]  Date Initiated:  01/26/2012  Documentation initiated by:  Donn Pierini  Subjective/Objective Assessment:   Pt admitted with cellulitis and abscess     Action/Plan:   PTA pt lived at home and was independent with ADLs   Anticipated DC Date:  01/29/2012   Anticipated DC Plan:  HOME/SELF CARE      DC Planning Services  CM consult  Medication Assistance      Choice offered to / List presented to:             Status of service:  Completed, signed off Medicare Important Message given?   (If response is "NO", the following Medicare IM given date fields will be blank) Date Medicare IM given:   Date Additional Medicare IM given:    Discharge Disposition:  HOME/SELF CARE  Per UR Regulation:    Comments:  [facility]P- [facility]  01/29/12 16:46 Letha Cape RN, BSN [phone] Patient for dc today, patient received med ast with two abx po and 3 day med supply for rest of meds.  Patient states he will make his follow up appts in [place] with the NP that he sees there.  01/28/12 16:45 Letha Cape RN, BSN [phone] Patient still on IV ABX, per MD will get ID consult for recs.  01/26/12- 1200- Donn Pierini RN, BSN [phone] Spoke with pt at bedside- per conversation pt states that he has been staying with Uncle here in town for last several weeks but usually stay with girlfriend in [place]. Pt reports that he has applied for disability- and usually goes to the [facility] for his medical needs and to get medications (tramadol and Neurontin). He also goes to [facility] in [place]. Per pharmacy pt is eligible for medication assistance with the indigent fund if needed at discharge. CM to follow for d/c planning/needs.

## 2012-01-29 NOTE — Discharge Summary (Signed)
Internal Medicine Teaching [facility] Discharge Note  Name: Albert Evans MRN: [id number] DOB: 12-Oct-1971 41 y.o.  Date of Admission: 01/23/2012  7:44 PM Date of Discharge: 01/29/2012 Attending Physician: Anderson Malta, DO  Discharge Diagnosis:  Principal Problem:  *Cellulitis and abscess, Groin and scrotum  Active Problems:  Pain  Anxiety   Discharge Medications: Medication List  As of 01/29/2012 11:54 AM   STOP taking these medications         mometasone 0.1 % cream         TAKE these medications         chlorhexidine 4 % external liquid   Commonly known as: HIBICLENS   Apply 60 mLs (4 application total) topically daily as needed. Cleanse groin area twice daily      clindamycin 300 MG capsule   Commonly known as: CLEOCIN   Take 1 capsule (300 mg total) by mouth 3 (three) times daily.      clonazePAM 1 MG tablet   Commonly known as: KLONOPIN   Take 1 tablet (1 mg total) by mouth 2 (two) times daily as needed for anxiety.      doxycycline 100 MG tablet   Commonly known as: VIBRA-TABS   Take 1 tablet (100 mg total) by mouth 2 (two) times daily.      gabapentin 300 MG capsule   Commonly known as: NEURONTIN   Take 2 capsules (600 mg total) by mouth 3 (three) times daily.      HYDROmorphone 4 MG tablet   Commonly known as: DILAUDID   Take 1 tablet (4 mg total) by mouth every 4 (four) hours as needed for pain.            Disposition and follow-up:   Mr.Albert Evans was discharged from [facility] in Good condition.    Follow-up Appointments: Follow-up Information    Call MD,EMERGENCY. (As needed if symptoms worsen)         Discharge Orders    Future Orders Please Complete By Expires   Diet general      Increase activity slowly      Discharge instructions      Comments:   Please return to ED or PCP if redness returns or pain/swelling increases in groin. Keep area clean and dry.   No wound care      Call MD for:   temperature >100.4      Call MD for:  redness, tenderness, or signs of infection (pain, swelling, redness, odor or green/yellow discharge around incision site)      Call MD for:  hives      Call MD for:  difficulty breathing, headache or visual disturbances         Consultations: Treatment Team:  Lindaann Slough, MD  Procedures Performed:  US Scrotum  01/27/2012  *RADIOLOGY REPORT*  Clinical Data: 41 year old male status post right groin I&D.  ULTRASOUND OF SCROTUM  Technique:  Complete ultrasound examination of the testicles, epididymis, and other scrotal structures was performed.  Comparison:  01/23/2012.  Findings:  Right testis:  Stable, normal echotexture.  4.6 x 2.4 x 3.0 cm. 1 or 2 punctate scrotal calcifications again noted, significance doubtful.  Left testis:  Stable, normal echotexture.  4.5 x 2.3 x 2.3 cm.  Right epididymis:  Normal in size and appearance.  Left epididymis:  Normal in size and appearance.  Hydrocele:  Trace bilateral greater on the right.  Varicocele:  Absent.  Other findings:  Preserved Doppler  flow in both testes on brief color Doppler interrogation.  Scrotal wall thickening and edema. Right groin soft tissue edema.  No superficial fluid collection. The right inguinal node seen on the previous study is not identified today.  IMPRESSION: Scrotal and groin soft tissue edema.  Stable and normal sonographic appearance of the testes.  Original Report Authenticated By: Harley Hallmark, M.D.   US Scrotum  01/23/2012  *RADIOLOGY REPORT*  Clinical Data: Right groin pain and discoloration for 1 week.  ULTRASOUND OF SCROTUM  Technique:  Complete ultrasound examination of the testicles, epididymis, and other scrotal structures was performed.  Comparison:  None.  Findings:  Right testis:  Measures 4.3 x 2.4 x 2.8 cm.  Normal homogeneous parenchymal echotexture without focal mass lesion.  Flow is demonstrated on color flow Doppler imaging.  Left testis:  Measures 4 x 2.5 x 2.7 cm.   Normal homogeneous parenchymal echotexture without focal mass lesion.  Normal flow is demonstrated on color flow Doppler imaging.  Right epididymis:  Normal in size and appearance.  Left epididymis:  Normal in size and appearance.  Hydrocele:  No evidence of hydrocele.  Varicocele:  No evidence of varicocele.  Lymphadenopathy is noted in the right groin with enlarged lymph node measuring up to 2.4 cm length.  IMPRESSION: Normal appearance of the testes.  Right groin lymphadenopathy.  Original Report Authenticated By: Marlon Pel, M.D.    Admission HPI:  Albert Evans is an 41 y.o. male with history of neck pain, status post prior neck surgery, on chronic pain medication, presents to the emergency room with erythema, swelling, and pain of his right groin and right upper thigh. This has been going on for a week. In the past he had similar symptoms and had an I&D of this area. He denied any fever or chills, nausea vomiting, dysuria, or any abdominal pain. Evaluation in the emergency room included a normal white count, normal renal function, and a normal urinalysis. Testicle ultrasound showed no focal abscess, but there is lymphadenopathy. Hospitalist was asked to admit patient for IV antibiotics. He constantly requesting more pain medication.   Hospital Course by problem list: Principal Problem:  *Cellulitis and abscess Active Problems:  Pain  Anxiety  Patient was admitted for Scrotal Cellulitis. Urology consult was obtained and he underwent surgical I/D for abscess drainage. He received 5 days of IV Vancomycin and Zosyn for complicated cellulitis and soft tissue infection. No deep structure were affected. He has significant improvement of induration, redness and pain during hospitalization and was discharged on an oral regimen of doxycycline and clindamycin. He was given oral dilaudid for pain control and given a script for his chronic anxiety medications. He is to follow-up with a PCP in  [place], [place]. If his symptoms return or he develops complications he has been instructed to return to the ED for evaluation.   Discharge Vitals:  BP 115/72  Pulse 80  Temp(Src) 97.7 F (36.5 C) (Oral)  Resp 18  Ht 5\' 10"  (1.778 m)  Wt 70.308 kg (155 lb)  BMI 22.24 kg/m2  SpO2 99%  Discharge Labs:  Results for orders placed during the hospital encounter of 01/23/12 (from the past 24 hour(s))  BASIC METABOLIC PANEL     Status: Abnormal   Collection Time   01/29/12  5:35 AM      Component Value Range   Sodium 137  135 - 145 (mEq/L)   Potassium 3.7  3.5 - 5.1 (mEq/L)   Chloride 103  96 - 112 (mEq/L)   CO2 25  19 - 32 (mEq/L)   Glucose, Bld 125 (*) 70 - 99 (mg/dL)   BUN 13  6 - 23 (mg/dL)   Creatinine, Ser 0.86  0.50 - 1.35 (mg/dL)   Calcium 9.2  8.4 - 57.8 (mg/dL)   GFR calc non Af Amer 76 (*) >90 (mL/min)   GFR calc Af Amer 88 (*) >90 (mL/min)    Signed: Terence Googe 01/29/2012, 11:54 AM

## 2012-01-29 NOTE — Progress Notes (Signed)
Subjective: Better. Less pain and swelling. Low grade temp. Chills. Objective: Vital signs in last 24 hours: Temp:  [97.7 F (36.5 C)-99.2 F (37.3 C)] 97.7 F (36.5 C) (02/14 0452) Pulse Rate:  [75-96] 80  (02/14 0452) Resp:  [18-20] 18  (02/14 0452) BP: (109-128)/(56-74) 115/72 mmHg (02/14 0452) SpO2:  [95 %-99 %] 99 % (02/14 0452) Weight change:  Last BM Date: 01/28/12  Intake/Output from previous day: 02/13 0701 - 02/14 0700 In: 6710 [P.O.:710; I.V.:4750; IV Piggyback:1250] Out: 1350 [Urine:1350] Total I/O In: 2110 [P.O.:360; I.V.:1250; IV Piggyback:500] Out: 250 [Urine:250]   Physical Exam: General: Alert, awake, oriented x3, in no acute distress. HEENT: No bruits, no goiter. Heart: Regular rate and rhythm, without murmurs, rubs, gallops. Lungs: Clear to auscultation bilaterally. Abdomen: Soft, nontender, nondistended, positive bowel sounds. Extremities: No clubbing cyanosis or edema with positive pedal pulses. Neuro: Grossly intact, nonfocal. Scrotum, very tender, red inflammed, indurated.    Lab Results: Basic Metabolic Panel: No results found for this basename: NA:2,K:2,CL:2,CO2:2,GLUCOSE:2,BUN:2,CREATININE:2,CALCIUM:2,MG:2,PHOS:2 in the last 72 hours Liver Function Tests: No results found for this basename: AST:2,ALT:2,ALKPHOS:2,BILITOT:2,PROT:2,ALBUMIN:2 in the last 72 hours No results found for this basename: LIPASE:2,AMYLASE:2 in the last 72 hours No results found for this basename: AMMONIA:2 in the last 72 hours CBC: No results found for this basename: WBC:2,NEUTROABS:2,HGB:2,HCT:2,MCV:2,PLT:2 in the last 72 hours Cardiac Enzymes: No results found for this basename: CKTOTAL:3,CKMB:3,CKMBINDEX:3,TROPONINI:3 in the last 72 hours BNP: No results found for this basename: PROBNP:3 in the last 72 hours D-Dimer: No results found for this basename: DDIMER:2 in the last 72 hours CBG: No results found for this basename: GLUCAP:6 in the last 72 hours Hemoglobin  A1C: No results found for this basename: HGBA1C in the last 72 hours Fasting Lipid Panel: No results found for this basename: CHOL,HDL,LDLCALC,TRIG,CHOLHDL,LDLDIRECT in the last 72 hours Thyroid Function Tests: No results found for this basename: TSH,T4TOTAL,FREET4,T3FREE,THYROIDAB in the last 72 hours Anemia Panel: No results found for this basename: VITAMINB12,FOLATE,FERRITIN,TIBC,IRON,RETICCTPCT in the last 72 hours Coagulation: No results found for this basename: LABPROT:2,INR:2 in the last 72 hours Urine Drug Screen: Drugs of Abuse  No results found for this basename: labopia, cocainscrnur, labbenz, amphetmu, thcu, labbarb    Alcohol Level: No results found for this basename: ETH:2 in the last 72 hours Urinalysis: No results found for this basename: COLORURINE:2,APPERANCEUR:2,LABSPEC:2,PHURINE:2,GLUCOSEU:2,HGBUR:2,BILIRUBINUR:2,KETONESUR:2,PROTEINUR:2,UROBILINOGEN:2,NITRITE:2,LEUKOCYTESUR:2 in the last 72 hours Misc. Labs:  Recent Results (from the past 240 hour(s))  WOUND CULTURE     Status: Normal   Collection Time   01/25/12  2:50 PM      Component Value Range Status Comment   Specimen Description WOUND RIGHT GROIN   Final    Special Requests SPECIMEN A, PT ON VANCO AND ZOSYN   Final    Gram Stain     Final    Value: NO WBC SEEN     NO SQUAMOUS EPITHELIAL CELLS SEEN     NO ORGANISMS SEEN   Culture NO GROWTH 2 DAYS   Final    Report Status 01/28/2012 FINAL   Final   ANAEROBIC CULTURE     Status: Normal (Preliminary result)   Collection Time   01/25/12  2:50 PM      Component Value Range Status Comment   Specimen Description WOUND RIGHT GROIN   Final    Special Requests SPECIMEN A, PT ON VANCO AND ZOSYN   Final    Gram Stain     Final    Value: NO WBC SEEN     NO  SQUAMOUS EPITHELIAL CELLS SEEN     NO ORGANISMS SEEN   Culture     Final    Value: NO ANAEROBES ISOLATED; CULTURE IN PROGRESS FOR 5 DAYS   Report Status PENDING   Incomplete   AFB CULTURE WITH SMEAR      Status: Normal (Preliminary result)   Collection Time   01/25/12  2:50 PM      Component Value Range Status Comment   Specimen Description WOUND RIGHT GROIN   Final    Special Requests NONE   Final    ACID FAST SMEAR NO ACID FAST BACILLI SEEN   Final    Culture     Final    Value: CULTURE WILL BE EXAMINED FOR 6 WEEKS BEFORE ISSUING A FINAL REPORT   Report Status PENDING   Incomplete   WOUND CULTURE     Status: Normal   Collection Time   01/25/12  2:53 PM      Component Value Range Status Comment   Specimen Description WOUND RIGHT GROIN   Final    Special Requests SPECIMEN B, PT ON VANCO AND ZOSYN   Final    Gram Stain     Final    Value: NO WBC SEEN     NO SQUAMOUS EPITHELIAL CELLS SEEN     NO ORGANISMS SEEN   Culture NO GROWTH 2 DAYS   Final    Report Status 01/28/2012 FINAL   Final   ANAEROBIC CULTURE     Status: Normal (Preliminary result)   Collection Time   01/25/12  2:53 PM      Component Value Range Status Comment   Specimen Description WOUND RIGHT GROIN   Final    Special Requests SPECIMEN B, PT ON VANCO AND ZOSYN   Final    Gram Stain     Final    Value: NO WBC SEEN     NO SQUAMOUS EPITHELIAL CELLS SEEN     NO ORGANISMS SEEN   Culture     Final    Value: NO ANAEROBES ISOLATED; CULTURE IN PROGRESS FOR 5 DAYS   Report Status PENDING   Incomplete   AFB CULTURE WITH SMEAR     Status: Normal (Preliminary result)   Collection Time   01/25/12  2:53 PM      Component Value Range Status Comment   Specimen Description WOUND RIGHT GROIN   Final    Special Requests NONE   Final    ACID FAST SMEAR NO ACID FAST BACILLI SEEN   Final    Culture     Final    Value: CULTURE WILL BE EXAMINED FOR 6 WEEKS BEFORE ISSUING A FINAL REPORT   Report Status PENDING   Incomplete   CULTURE, BLOOD (ROUTINE X 2)     Status: Normal (Preliminary result)   Collection Time   01/27/12  8:15 AM      Component Value Range Status Comment   Specimen Description BLOOD RIGHT ARM   Final    Special  Requests BOTTLES DRAWN AEROBIC AND ANAEROBIC 10CC   Final    Culture  Setup Time [id number]   Final    Culture     Final    Value:        BLOOD CULTURE RECEIVED NO GROWTH TO DATE CULTURE WILL BE HELD FOR 5 DAYS BEFORE ISSUING A FINAL NEGATIVE REPORT   Report Status PENDING   Incomplete   CULTURE, BLOOD (ROUTINE X 2)     Status: Normal (  Preliminary result)   Collection Time   01/27/12  8:30 AM      Component Value Range Status Comment   Specimen Description BLOOD RIGHT HAND   Final    Special Requests     Final    Value: BOTTLES DRAWN AEROBIC AND ANAEROBIC 10CC BLUE 5CC RED   Culture  Setup Time [id number]   Final    Culture     Final    Value:        BLOOD CULTURE RECEIVED NO GROWTH TO DATE CULTURE WILL BE HELD FOR 5 DAYS BEFORE ISSUING A FINAL NEGATIVE REPORT   Report Status PENDING   Incomplete     Studies/Results: US Scrotum  01/27/2012  *RADIOLOGY REPORT*  Clinical Data: 41 year old male status post right groin I&D.  ULTRASOUND OF SCROTUM  Technique:  Complete ultrasound examination of the testicles, epididymis, and other scrotal structures was performed.  Comparison:  01/23/2012.  Findings:  Right testis:  Stable, normal echotexture.  4.6 x 2.4 x 3.0 cm. 1 or 2 punctate scrotal calcifications again noted, significance doubtful.  Left testis:  Stable, normal echotexture.  4.5 x 2.3 x 2.3 cm.  Right epididymis:  Normal in size and appearance.  Left epididymis:  Normal in size and appearance.  Hydrocele:  Trace bilateral greater on the right.  Varicocele:  Absent.  Other findings:  Preserved Doppler flow in both testes on brief color Doppler interrogation.  Scrotal wall thickening and edema. Right groin soft tissue edema.  No superficial fluid collection. The right inguinal node seen on the previous study is not identified today.  IMPRESSION: Scrotal and groin soft tissue edema.  Stable and normal sonographic appearance of the testes.  Original Report Authenticated By: Harley Hallmark, M.D.      Medications: Scheduled Meds:   . docusate sodium  100 mg Oral BID  . famotidine  20 mg Oral Daily  . gabapentin  600 mg Oral BID  . heparin  5,000 Units Subcutaneous Q8H  . piperacillin-tazobactam (ZOSYN)  IV  3.375 g Intravenous Q8H  . vancomycin  1,000 mg Intravenous Q8H   Continuous Infusions:   . dextrose 5 % and 0.9% NaCl 125 mL/hr at 01/29/12 0022   PRN Meds:.acetaminophen, acetaminophen, clonazePAM, HYDROmorphone, ondansetron (ZOFRAN) IV, ondansetron, promethazine, zolpidem, DISCONTD: HYDROmorphone, DISCONTD: meperidine  Assessment/Plan: R groin and scrotum Cellulitis with possible abscess- s;/p I&D, erthymatic area is decreased with antibiotics, vanc/zosyn day #5, appreciate urology following, no WBC count- wound culture sent- NGTD, repeat U/S ok -still with low grade temp 2. Pain- adjusted dilaudid  3. Heparin/colace 4. Anxiety- prn klonopin-off floor priviledge 5. Dispo: possible transition to PO antibiotics and d/c home with close outpatient follow-up if afebrile and induration decreased.    LOS: 6 days   [facility] Pager: [phone] 01/29/2012, 7:00 AM

## 2012-01-29 NOTE — Progress Notes (Signed)
  Subjective: Patient reports : pain right groin.  Objective: Vital signs in last 24 hours: Temp:  [97.7 F (36.5 C)-99 F (37.2 C)] 97.7 F (36.5 C) (02/14 0452) Pulse Rate:  [75-90] 80  (02/14 0452) Resp:  [18-20] 18  (02/14 0452) BP: (109-118)/(64-74) 115/72 mmHg (02/14 0452) SpO2:  [96 %-99 %] 99 % (02/14 0452)   Physical Exam:  Scrotum not swollen,  Right groin less swollen and tender.  No fluctuant area. Both incisions look clean.  Assessment/Plan:  S/P I&D scrotal abscess  P: Continue wet to dry dressing.            Consider switching to oral antibiotics and analgesics.   LOS: 6 days   Albert Evans 01/29/2012, 8:12 AM

## 2012-01-29 NOTE — Progress Notes (Signed)
Pt d/c home. D/cinstructions reviewed with pt. Pt provided with dressings and with hibeclins solutions ordered per MD, filled by hospital pharmacy. Pt was also provided with both antibiotics and his neurontin script filled by hospital pharmacy. Other scripts were given to pt to have filled at his pharmacy, which included pain medication.  Education provided. Pt verbalized understanding of all instructions. Copy of instructions given to pt. Pt d/c'd with his belongings, declined wheelchair ride upon leaving. Steady gait, and pt had already walked outside once before per MD order. Pt's ride waiting at exit.

## 2012-01-29 NOTE — Progress Notes (Signed)
Spoke with patient, he states  He will need med ast, informed MD , pt is eligible for zz fund.  Also called pt's girlfriend for his transportation at [phone] but did not get an answer , I left a message and a txt page, but has not received a call back yet.

## 2012-01-30 ENCOUNTER — Other Ambulatory Visit: Payer: Self-pay | Admitting: Internal Medicine

## 2012-01-30 LAB — ANAEROBIC CULTURE

## 2012-01-30 MED ORDER — TRAMADOL HCL 50 MG PO TABS: 50.0000 mg | ORAL_TABLET | Freq: Four times a day (QID) | ORAL | Status: AC | PRN

## 2012-02-02 LAB — CULTURE, BLOOD (ROUTINE X 2)
Culture: NO GROWTH
Culture: NO GROWTH

## 2012-02-06 ENCOUNTER — Emergency Department (HOSPITAL_COMMUNITY)
Admission: EM | Admit: 2012-02-06 | Discharge: 2012-02-06 | Disposition: A | Discharge status: Home / Self Care | Payer: Self-pay | Attending: Emergency Medicine | Admitting: Emergency Medicine

## 2012-02-06 ENCOUNTER — Encounter (HOSPITAL_COMMUNITY): Payer: Self-pay | Admitting: *Deleted

## 2012-02-06 DIAGNOSIS — M5030 Other cervical disc degeneration, unspecified cervical region: Secondary | ICD-10-CM | POA: Insufficient documentation

## 2012-02-06 DIAGNOSIS — R45851 Suicidal ideations: Secondary | ICD-10-CM | POA: Insufficient documentation

## 2012-02-06 DIAGNOSIS — F329 Major depressive disorder, single episode, unspecified: Secondary | ICD-10-CM | POA: Insufficient documentation

## 2012-02-06 DIAGNOSIS — F3289 Other specified depressive episodes: Secondary | ICD-10-CM | POA: Insufficient documentation

## 2012-02-06 DIAGNOSIS — R109 Unspecified abdominal pain: Secondary | ICD-10-CM | POA: Insufficient documentation

## 2012-02-06 DIAGNOSIS — F17200 Nicotine dependence, unspecified, uncomplicated: Secondary | ICD-10-CM | POA: Insufficient documentation

## 2012-02-06 HISTORY — DX: Other cervical disc displacement, unspecified cervical region: M50.20

## 2012-02-06 LAB — COMPREHENSIVE METABOLIC PANEL
ALT: 33 U/L (ref 0–53)
AST: 45 U/L — ABNORMAL HIGH (ref 0–37)
CO2: 24 mEq/L (ref 19–32)
Calcium: 10.2 mg/dL (ref 8.4–10.5)
Chloride: 103 mEq/L (ref 96–112)
Creatinine, Ser: 1.18 mg/dL (ref 0.50–1.35)
GFR calc Af Amer: 88 mL/min — ABNORMAL LOW (ref >90)
GFR calc non Af Amer: 76 mL/min — ABNORMAL LOW (ref >90)
Glucose, Bld: 146 mg/dL — ABNORMAL HIGH (ref 70–99)
Total Bilirubin: 0.3 mg/dL (ref 0.3–1.2)

## 2012-02-06 LAB — CBC
Hemoglobin: 12.9 g/dL — ABNORMAL LOW (ref 13.0–17.0)
MCH: 31.9 pg (ref 26.0–34.0)
MCV: 89.9 fL (ref 78.0–100.0)
Platelets: 490 10*3/uL — ABNORMAL HIGH (ref 150–400)
RBC: 4.04 MIL/uL — ABNORMAL LOW (ref 4.22–5.81)
WBC: 5.9 10*3/uL (ref 4.0–10.5)

## 2012-02-06 LAB — RAPID URINE DRUG SCREEN, HOSP PERFORMED
Barbiturates: NOT DETECTED
Cocaine: POSITIVE — AB
Tetrahydrocannabinol: POSITIVE — AB

## 2012-02-06 MED ORDER — ONDANSETRON HCL 8 MG PO TABS: 4.0000 mg | ORAL_TABLET | Freq: Three times a day (TID) | ORAL | Status: DC | PRN

## 2012-02-06 MED ORDER — HYDROMORPHONE HCL 2 MG PO TABS
4.0000 mg | ORAL_TABLET | Freq: Once | ORAL | Status: AC
  Administered 2012-02-06: 4 mg via ORAL
  Filled 2012-02-06: qty 2

## 2012-02-06 MED ORDER — LORAZEPAM 1 MG PO TABS
1.0000 mg | ORAL_TABLET | Freq: Three times a day (TID) | ORAL | Status: DC | PRN
  Administered 2012-02-06: 1 mg via ORAL
  Filled 2012-02-06 (×3): qty 1

## 2012-02-06 MED ORDER — ALUM & MAG HYDROXIDE-SIMETH 200-200-20 MG/5ML PO SUSP: 30.0000 mL | ORAL | Status: DC | PRN

## 2012-02-06 MED ORDER — ZOLPIDEM TARTRATE 5 MG PO TABS
5.0000 mg | ORAL_TABLET | Freq: Every evening | ORAL | Status: DC | PRN
  Administered 2012-02-06: 5 mg via ORAL
  Filled 2012-02-06: qty 1

## 2012-02-06 MED ORDER — HYDROCODONE-ACETAMINOPHEN 5-325 MG PO TABS
1.0000 | ORAL_TABLET | Freq: Once | ORAL | Status: AC
  Administered 2012-02-06: 1 via ORAL
  Filled 2012-02-06 (×2): qty 1

## 2012-02-06 MED ORDER — NICOTINE 21 MG/24HR TD PT24
21.0000 mg | MEDICATED_PATCH | Freq: Every day | TRANSDERMAL | Status: DC
  Filled 2012-02-06: qty 1

## 2012-02-06 MED ORDER — IBUPROFEN 200 MG PO TABS
600.0000 mg | ORAL_TABLET | Freq: Three times a day (TID) | ORAL | Status: DC | PRN
  Administered 2012-02-06: 600 mg via ORAL
  Filled 2012-02-06: qty 1
  Filled 2012-02-06: qty 3

## 2012-02-06 NOTE — ED Notes (Signed)
Sitter at bedside.

## 2012-02-06 NOTE — ED Notes (Signed)
Pt went to [facility] and was sent here for medical clearance as he had surgery to his groin last week.  Pt went to [facility] because he has some "crazy things going through my head".  Pt states that he has had suicidal ideation.  No concrete plan.  Pt is alcoholic but states that he is not here for detox but rather trying to get mental health for depression.  Pt is alert and oriented in triage, flat affect.

## 2012-02-06 NOTE — ED Notes (Signed)
Act team member in for consult.

## 2012-02-06 NOTE — ED Notes (Signed)
PA  P Damen in room talking with patient.  Act team member talking with PA.

## 2012-02-06 NOTE — ED Notes (Signed)
Determined by Dr. Kandis Nab, ACT Team that pt is not suicidal.
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
## 2012-02-06 NOTE — ED Notes (Signed)
Dr. Juleen China at bedside talking with pt.  Awaiting bed assignment.

## 2012-02-06 NOTE — Discharge Instructions (Signed)
Depression You have signs of depression. This is a common problem. It can occur at any age. It is often hard to recognize. People can suffer from depression and still have moments of enjoyment. Depression interferes with your basic ability to function in life. It upsets your relationships, sleep, eating, and work habits. CAUSES  Depression is believed to be caused by an imbalance in brain chemicals. It may be triggered by an unpleasant event. Relationship crises, a death in the family, financial worries, retirement, or other stressors are normal causes of depression. Depression may also start for no known reason. Other factors that may play a part include medical illnesses, some medicines, genetics, and alcohol or drug abuse. SYMPTOMS   Feeling unhappy or worthless.   Long-lasting (chronic) tiredness or worn-out feeling.   Self-destructive thoughts and actions.   Not being able to sleep or sleeping too much.   Eating more than usual or not eating at all.   Headaches or feeling anxious.   Trouble concentrating or making decisions.   Unexplained physical problems and substance abuse.  TREATMENT  Depression usually gets better with treatment. This can include:  Antidepressant medicines. It can take weeks before the proper dose is achieved and benefits are reached.   Talking with a therapist, clergyperson, counselor, or friend. These people can help you gain insight into your problem and regain control of your life.   Eating a good diet.   Getting regular physical exercise, such as walking for 30 minutes every day.   Not abusing alcohol or drugs.  Treating depression often takes 6 months or longer. This length of treatment is needed to keep symptoms from returning. Call your caregiver and arrange for follow-up care as suggested. SEEK IMMEDIATE MEDICAL CARE IF:   You start to have thoughts of hurting yourself or others.   Call your local emergency services (911 in [place]   Go to  your local medical emergency department.   Call the National Suicide Prevention Lifeline: 1-800-273-TALK 361-806-1579).  Document Released: 12/01/2005 Document Revised: 08/13/2011 Document Reviewed: 05/03/2010 [facility] Patient Information 2012 [place], [place].Stress Management Stress is a state of physical or mental tension that often results from changes in your life or normal routine. Some common causes of stress are:  Death of a loved one.   Injuries or severe illnesses.   Getting fired or changing jobs.   Moving into a new home.  Other causes may be:  Sexual problems.   Business or financial losses.   Taking on a large debt.   Regular conflict with someone at home or at work.   Constant tiredness from lack of sleep.  It is not just bad things that are stressful. It may be stressful to:  Win the lottery.   Get married.   Buy a new car.  The amount of stress that can be easily tolerated varies from person to person. Changes generally cause stress, regardless of the types of change. Too much stress can affect your health. It may lead to physical or emotional problems. Too little stress (boredom) may also become stressful. SUGGESTIONS TO REDUCE STRESS:  Talk things over with your family and friends. It often is helpful to share your concerns and worries. If you feel your problem is serious, you may want to get help from a professional counselor.   Consider your problems one at a time instead of lumping them all together. Trying to take care of everything at once may seem impossible. List all the things you need  to do and then start with the most important one. Set a goal to accomplish 2 or 3 things each day. If you expect to do too many in a single day you will naturally fail, causing you to feel even more stressed.   Do not use alcohol or drugs to relieve stress. Although you may feel better for a short time, they do not remove the problems that caused the stress. They can  also be habit forming.   Exercise regularly - at least 3 times per week. Physical exercise can help to relieve that "uptight" feeling and will relax you.   The shortest distance between despair and hope is often a good night's sleep.   Go to bed and get up on time allowing yourself time for appointments without being rushed.   Take a short "time-out" period from any stressful situation that occurs during the day. Close your eyes and take some deep breaths. Starting with the muscles in your face, tense them, hold it for a few seconds, then relax. Repeat this with the muscles in your neck, shoulders, hand, stomach, back and legs.   Take good care of yourself. Eat a balanced diet and get plenty of rest.   Schedule time for having fun. Take a break from your daily routine to relax.  HOME CARE INSTRUCTIONS   Call if you feel overwhelmed by your problems and feel you can no longer manage them on your own.   Return immediately if you feel like hurting yourself or someone else.  Document Released: 05/27/2001 Document Revised: 08/13/2011 Document Reviewed: 01/17/2008 [facility] Patient Information 2012 [place], [place].Suicidal Feelings, How to Help Yourself Everyone feels sad or unhappy at times, but depressing thoughts and feelings of hopelessness can lead to thoughts of suicide. It can seem as if life is too tough to handle. It is as if the mountain is just too high and your climbing skills are not great enough. At that moment these dark thoughts and feelings may seem overwhelming and never ending. It is important to remember these feelings are temporary! They will go away. If you feel as though you have reached the point where suicide is the only answer, it is time to let someone know immediately. This is the first step to feeling better. The following steps will move you to safer ground and lead you in a positive direction out of depression. HOW TO COPE AND PREVENT SUICIDE  Let family, friends,  teachers and/or counselors know. Get help. Try not to isolate yourself from those who care about you. Even though you may not feel sociable or think that you are not good company, talk with someone everyday. It is best if it is face to face. Remember, they will want to help you.   Eat a regularly spaced and well-balanced diet, and get plenty of rest.   Avoid alcohol and drugs because they will only make you feel worse and may also lower your inhibitions. Remove them from the home. If you are thinking of taking an overdose of your prescribed medications, give your medicines to someone who can give them to you one day at a time. If you are on antidepressants, let your caregiver know of your feelings so he or she can provide a safer medication, if that is a concern.   Remove weapons or poisons from your home.   Try to stick to routines. That may mean just walking the dog or feeding the cat. Follow a schedule and remind yourself that  you have to keep that schedule every day. Play with your pets. If it is possible, and you do not have a pet, get one. They give you a sense of well-being, lower your blood pressure and make your heart feel good. They need you, and we all want to be needed.   Set some realistic goals and achieve them. Make a list and cross things off as you go. Accomplishments give a sense of worth. Wait until you are feeling better before doing things you find difficult or unpleasant to do.   If you are able, try to start exercising. Even half-hour periods of exercise each day will make you feel better. Getting out in the sun or into nature helps you recover from depression faster. If you have a favorite place to walk, take advantage of that.   Increase safe activities that have always given you pleasure. This may include playing your favorite music, reading a good book, painting a picture or playing your favorite instrument. Do whatever takes your mind off your depression and puts a smile on  your face.   Keep your living space well lit with windows open, and let the sun shine in. Bright light definitely treats depression, not just people with the seasonal affective disorders (SAD).  Above all else remember, depression is temporary. It will go away. Do not contemplate suicide. Death as a permanent solution is not the answer. Suicide will take away the beautiful rest of life, and do lifelong harm to those around you who love you. Help is available. National Suicide Help Lines with 24 hour help are: 1-800-SUICIDE 605-419-5663 Document Released: 06/07/2003 Document Revised: 08/13/2011 Document Reviewed: 10/26/2007 [facility] Patient Information 2012 [place], [place].  RESOURCE GUIDE  Dental Problems  Patients with Medicaid: [facility] [phone] [address]                                           [phone] [address] Phone:  [phone]                                                  Phone:  [phone]  If unable to pay or uninsured, contact:  [facility] or [facility]. to become qualified for the adult dental clinic.  Chronic Pain Problems Contact [facility]  [phone] Patients need to be referred by their primary care doctor.  Insufficient Money for Medicine Contact [employer]:  call "211" or [facility] (270)073-2950.  No Primary Care Doctor Call [facility]  ([phone] Other agencies that provide inexpensive medical care    [facility]  [phone]    [facility]  [phone]    [facility]  (647)385-1622    [facility]  [phone]    [facility]  [phone]    [facility]  [phone]  Psychological Services [facility] Behavioral Health  [phone] [facility] Services  [phone] [facility]   [phone] (emergency services 2170048183)  Substance Abuse Resources [facility]  7053694194 [facility] [phone] [facility] [place]  [facility] (705) [phone] [facility] 6318082912 Residential & Outpatient Substance Abuse Program  727-512-5727  Abuse/Neglect [facility] Child Abuse Hotline (820)117-3035 [facility] Child Abuse Hotline 803-017-8549 (After Hours)  Emergency Shelter [facility] (540)173-9612  Maternity Homes Room at the [place] [facility] 818-151-3855 [facility] Services [phone]  MRSA Hotline #:   (912)801-4552    [facility] Resources  [facility]     [employer]                          [facility] Dept. [address] [place]                       [address]      [address]  Blondell Reveal Phone:  [phone]                                   Phone:  [phone]                 Phone:  [phone]  [facility] Phone:  [phone]  [facility] Child Abuse Hotline (249)860-9505 845-292-0435 (After Hours)

## 2012-02-06 NOTE — ED Notes (Signed)
Report received, assumed care.  
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
## 2012-02-06 NOTE — ED Notes (Signed)
Pt is aware that we need a urine specimen. Pt was informed that when he goes to bathroom he needs to give specimen. He stated that he would let sitter know when he needs to go.

## 2012-02-06 NOTE — ED Notes (Signed)
Ordered Breakfast 

## 2012-02-06 NOTE — ED Notes (Signed)
Pt voicing wishes to leave. Irving Burton, ACT Team & Dr. Juleen China aware.

## 2012-02-06 NOTE — ED Notes (Addendum)
Dr. Juleen China aware of pt's request for pain medicine. No order received. Offered pt ibuprofen for pain, pt refused. Informed patient and/or family of status. Awaiting bed assignment.

## 2012-02-06 NOTE — ED Notes (Signed)
Informed patient and/or family of status.  Awaiting bed assignment.  

## 2012-02-06 NOTE — ED Provider Notes (Addendum)
Patient assessed. Patient is complaining of some pain in his right groin area. This is an area of a prior abscess I&D. Examination looks grossly normal with a well healed incision. There is no evidence of new abscess formation. There is no inguinal adenopathy. Testicular exam is unremarkable. Patient is requesting pain medication for this. Offered Tylenol or ibuprofen but is declining. Asking for narcotics was explained to him that he would not be getting any. Patient is still awaiting placement. Was declined by behavioral health. Was again updated on this process and that might be prolonged.  Raeford Razor, MD 02/06/12 1617  Pt remains evasive and exhibiting malingering behavior. Says doesn't want to go because has no place to go to. Medically cleared. Still says SI but no plan. Social work paged to discuss shelters and resources.  Raeford Razor, MD 02/06/12 [phone]

## 2012-02-06 NOTE — ED Notes (Signed)
Pt now stating he can't leave because he has no where to go for another week.  "I have no choice but to stay". Informed Irving Burton ED MD

## 2012-02-07 NOTE — ED Provider Notes (Signed)
Medical screening examination/treatment/procedure(s) were performed by non-physician practitioner and as supervising physician I was immediately available for consultation/collaboration.   Geoffery Lyons, MD 02/07/12 [phone]

## 2012-03-12 LAB — AFB CULTURE WITH SMEAR (NOT AT ARMC)

## 2017-06-12 ENCOUNTER — Encounter (HOSPITAL_COMMUNITY): Payer: Self-pay | Admitting: *Deleted

## 2017-06-12 ENCOUNTER — Inpatient Hospital Stay (HOSPITAL_COMMUNITY)
Admission: EM | Admit: 2017-06-12 | Discharge: 2017-06-16 | DRG: 513 | Disposition: A | Discharge status: Home / Self Care | Payer: Medicaid Other | Attending: Orthopedic Surgery | Admitting: Orthopedic Surgery

## 2017-06-12 ENCOUNTER — Emergency Department (HOSPITAL_COMMUNITY): Payer: Medicaid Other

## 2017-06-12 DIAGNOSIS — Z91013 Allergy to seafood: Secondary | ICD-10-CM

## 2017-06-12 DIAGNOSIS — Z23 Encounter for immunization: Secondary | ICD-10-CM

## 2017-06-12 DIAGNOSIS — Z888 Allergy status to other drugs, medicaments and biological substances status: Secondary | ICD-10-CM

## 2017-06-12 DIAGNOSIS — M65041 Abscess of tendon sheath, right hand: Principal | ICD-10-CM | POA: Diagnosis present

## 2017-06-12 DIAGNOSIS — L0390 Cellulitis, unspecified: Secondary | ICD-10-CM | POA: Diagnosis present

## 2017-06-12 DIAGNOSIS — F17210 Nicotine dependence, cigarettes, uncomplicated: Secondary | ICD-10-CM | POA: Diagnosis present

## 2017-06-12 DIAGNOSIS — L03113 Cellulitis of right upper limb: Secondary | ICD-10-CM | POA: Diagnosis present

## 2017-06-12 MED ORDER — TETANUS-DIPHTH-ACELL PERTUSSIS 5-2.5-18.5 LF-MCG/0.5 IM SUSP
0.5000 mL | Freq: Once | INTRAMUSCULAR | Status: AC
  Administered 2017-06-12: 0.5 mL via INTRAMUSCULAR
  Filled 2017-06-12: qty 0.5

## 2017-06-12 MED ORDER — HYDROMORPHONE HCL 1 MG/ML IJ SOLN
1.0000 mg | Freq: Once | INTRAMUSCULAR | Status: AC
  Administered 2017-06-12: 1 mg via INTRAVENOUS
  Filled 2017-06-12: qty 1
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
## 2017-06-12 NOTE — ED Notes (Signed)
Pt up at nurse first requesting update, apologized for delay and explained he would be next back when room becomes available.

## 2017-06-12 NOTE — ED Notes (Signed)
P 

## 2017-06-12 NOTE — ED Provider Notes (Signed)
[facility]-EMERGENCY DEPT Provider Note   CSN: [id number] Arrival date & time: 06/12/17  1705     History   Chief Complaint Chief Complaint  Patient presents with  . Hand Pain    HPI Albert Evans is a 46 y.o. male who is a poor historian and presents to the emergency department with worsening swelling and pain to the right hand that began 4-5 days ago after he had a nail pierce his right middle finger while he was pulling up carpet. He reports that the pain has significantly worsened over the last 24-hours and he is not able to straighten out the third or fourth fingers. He reports that he scraped the knuckle of the middle finger against a brick wall about 1 week ago. He denies any recent flights or altercations. No fevers or chills. No history of diabetes or other chronic medical conditions. Unsure when his last Tdap was updated.  The history is provided by the patient. No language interpreter was used.    Past Medical History:  Diagnosis Date  . Ruptured disc, cervical     Patient Active Problem List   Diagnosis Date Noted  . Cellulitis 06/13/2017  . Pain 01/26/2012  . Anxiety 01/26/2012  . Cellulitis and abscess 01/24/2012    Past Surgical History:  Procedure Laterality Date  . IRRIGATION AND DEBRIDEMENT ABSCESS  01/25/2012   Procedure: IRRIGATION AND DEBRIDEMENT ABSCESS;  Surgeon: Lindaann Slough, MD;  Location: [facility] OR;  Service: Urology;  Laterality: N/A;  . NECK SURGERY       Home Medications    Prior to Admission medications   Medication Sig Start Date End Date Taking? Authorizing Provider  ibuprofen (ADVIL,MOTRIN) 200 MG tablet Take 800-1,000 mg by mouth every 6 (six) hours as needed (for neck pain).   Yes [provider]  pregabalin (LYRICA) 150 MG capsule Take 150 mg by mouth 2 (two) times daily.   Yes [provider]    Family History No family history on file.  Social History Social History  Substance Use Topics  . Smoking status:  Current Every Day Smoker    Packs/day: 0.50    Years: 5.00    Types: Cigarettes  . Smokeless tobacco: Never Used  . Alcohol use Yes     Comment: social     Allergies   Fish-derived products; Shellfish-derived products; Flexeril [cyclobenzaprine hcl]; and Robaxin [methocarbamol]   Review of Systems Review of Systems  Constitutional: Negative for chills and fever.  Musculoskeletal: Positive for arthralgias and myalgias.  Skin: Positive for color change and wound.   Physical Exam Updated Vital Signs BP 116/76   Pulse 63   Temp 99.5 F (37.5 C) (Oral)   Resp 14   Ht 5\' 10"  (1.778 m)   Wt 70.3 kg (155 lb)   SpO2 100%   BMI 22.24 kg/m   Physical Exam  Constitutional: He appears well-developed.  HENT:  Head: Normocephalic.  Eyes: Conjunctivae are normal.  Neck: Neck supple.  Cardiovascular: Normal rate, regular rhythm, normal heart sounds and intact distal pulses.   No murmur heard. Pulmonary/Chest: Effort normal and breath sounds normal.  Abdominal: Soft. He exhibits no distension.  Musculoskeletal:  See included pictures of right hand. Profuse swelling throughout all 5 digits and throughout the right hand. The right hand is approximately twice the size of the left hand. Some warmth to the touch. Possible red streaking up the arm, but the patient has has a first-degree sunburn present throughout the right upper extremity.  No obvious drainage. There is are 2 healing abrasions:one to the third MCP and the second distal to the PIP joint. Radial pulses 2+ bilaterally. Sensation is intact. Decreased grip strength secondary to pain and swelling. The patient is unable to fully extend the third and fourth digits. Full range of motion of the thumb and fifth digit.  Neurological: He is alert.  Skin: Skin is warm and dry.  Psychiatric: His behavior is normal.  Nursing note and vitals reviewed.         ED Treatments / Results  Labs (all labs ordered are listed, but only  abnormal results are displayed) Labs Reviewed  CBC - Abnormal; Notable for the following:       Result Value   Hemoglobin 12.7 (*)    HCT 38.8 (*)    All other components within normal limits  RAPID URINE DRUG SCREEN, HOSP PERFORMED - Abnormal; Notable for the following:    Amphetamines POSITIVE (*)    All other components within normal limits  BASIC METABOLIC PANEL    EKG  EKG Interpretation None       Radiology Dg Hand Complete Right  Result Date: 06/12/2017 CLINICAL DATA:  46 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 Abnormal second third and fourth fingers on exam. EXAM: RIGHT HAND - COMPLETE 3+ VIEW COMPARISON:  None. FINDINGS: Bone mineralization is within normal limits. Distal right radius and ulna appear intact. 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 Metacarpals appear intact, probable healed remote distal fifth metacarpal fracture. Soft tissue swelling second third and fourth fingers. No subcutaneous gas. No radiopaque foreign body identified. No phalanx fracture or dislocation identified. IMPRESSION: 1.  No acute fracture or dislocation identified in the right hand. 2. Soft tissue swelling second through fourth fingers with no subcutaneous gas or radiopaque foreign body identified. Electronically Signed   By: Odessa FlemingH  Hall M.D.   On: 06/12/2017 19:12    Procedures Procedures (including critical care time)  Medications Ordered in ED Medications  vancomycin (VANCOCIN) IVPB 1000 mg/200 mL premix (not administered)  ampicillin-sulbactam (UNASYN) 1.5 g in sodium chloride 0.9 % 50 mL IVPB (not administered)  HYDROmorphone (DILAUDID) injection 1 mg (1 mg Intravenous Given 06/12/17 2346)  Tdap (BOOSTRIX) injection 0.5 mL (0.5 mLs Intramuscular Given 06/12/17 2340)  vancomycin (VANCOCIN) IVPB 1000 mg/200 mL premix (1,000 mg Intravenous New Bag/Given 06/13/17 0119)     Initial Impression  / Assessment and Plan / ED Course  I have reviewed the triage vital signs and the nursing notes.  Pertinent labs & imaging results that were available during my care of the patient were reviewed by me and considered in my medical decision making (see chart for details).     Patient presents to the emergency department with infection of the right hand. The patient was seen and evaluated with Dr. Bebe ShaggyWickline, attending physician. Possible pyogenic tenosynovitis versus cellulitis. TDap updated in the emergency department. No leukocytosis noted on CBC. The patient appears somewhat somnolent and intermittently anxious. Denies substance use. UDS positive for amphetamines. After reviewing the medical record, the patient has a history of previous abscesses that have required I&D. Consulted Dr. Amanda PeaGramig with hand surgery, who will admit the patient overnight for IV antibiotics and plan for surgical irrigation and debridement in the operating room tomorrow morning.  Final Clinical Impressions(s) / ED Diagnoses   Final diagnoses:  Cellulitis of right hand    New Prescriptions New Prescriptions  No medications on file     Barkley Boards, PA-C 06/13/17 0236    Jaan Fischel A, PA-C 06/13/17 1707    Zadie Rhine, MD 06/14/17 [phone]

## 2017-06-13 ENCOUNTER — Observation Stay (HOSPITAL_COMMUNITY): Payer: Medicaid Other | Admitting: Anesthesiology

## 2017-06-13 ENCOUNTER — Encounter (HOSPITAL_COMMUNITY)
Admission: EM | Disposition: A | Discharge status: Home / Self Care | Payer: Self-pay | Source: Home / Self Care | Attending: Orthopedic Surgery

## 2017-06-13 ENCOUNTER — Encounter (HOSPITAL_COMMUNITY): Payer: Self-pay | Admitting: Anesthesiology

## 2017-06-13 DIAGNOSIS — L03113 Cellulitis of right upper limb: Secondary | ICD-10-CM | POA: Diagnosis present

## 2017-06-13 DIAGNOSIS — M65041 Abscess of tendon sheath, right hand: Secondary | ICD-10-CM | POA: Diagnosis present

## 2017-06-13 DIAGNOSIS — Z23 Encounter for immunization: Secondary | ICD-10-CM | POA: Diagnosis not present

## 2017-06-13 DIAGNOSIS — L0390 Cellulitis, unspecified: Secondary | ICD-10-CM | POA: Diagnosis present

## 2017-06-13 DIAGNOSIS — F17210 Nicotine dependence, cigarettes, uncomplicated: Secondary | ICD-10-CM | POA: Diagnosis present

## 2017-06-13 DIAGNOSIS — Z91013 Allergy to seafood: Secondary | ICD-10-CM | POA: Diagnosis not present

## 2017-06-13 DIAGNOSIS — Z888 Allergy status to other drugs, medicaments and biological substances status: Secondary | ICD-10-CM | POA: Diagnosis not present

## 2017-06-13 HISTORY — PX: I & D EXTREMITY: SHX5045

## 2017-06-13 LAB — CBC
HCT: 38.8 % — ABNORMAL LOW (ref 39.0–52.0)
Hemoglobin: 12.7 g/dL — ABNORMAL LOW (ref 13.0–17.0)
MCH: 29.3 pg (ref 26.0–34.0)
MCHC: 32.7 g/dL (ref 30.0–36.0)
MCV: 89.4 fL (ref 78.0–100.0)
PLATELETS: 271 10*3/uL (ref 150–400)
RBC: 4.34 MIL/uL (ref 4.22–5.81)
RDW: 12.7 % (ref 11.5–15.5)
WBC: 8 10*3/uL (ref 4.0–10.5)

## 2017-06-13 LAB — SURGICAL PCR SCREEN
MRSA, PCR: POSITIVE — AB
STAPHYLOCOCCUS AUREUS: POSITIVE — AB

## 2017-06-13 LAB — BASIC METABOLIC PANEL
Anion gap: 5 (ref 5–15)
BUN: <5 mg/dL — ABNORMAL LOW (ref 6–20)
CHLORIDE: 104 mmol/L (ref 101–111)
CO2: 27 mmol/L (ref 22–32)
Calcium: 8.8 mg/dL — ABNORMAL LOW (ref 8.9–10.3)
Creatinine, Ser: 1.06 mg/dL (ref 0.61–1.24)
GFR calc Af Amer: >60 mL/min (ref >60)
GFR calc non Af Amer: >60 mL/min (ref >60)
GLUCOSE: 126 mg/dL — AB (ref 65–99)
POTASSIUM: 4 mmol/L (ref 3.5–5.1)
SODIUM: 136 mmol/L (ref 135–145)

## 2017-06-13 LAB — RAPID URINE DRUG SCREEN, HOSP PERFORMED
Amphetamines: POSITIVE — AB
BENZODIAZEPINES: NOT DETECTED
Barbiturates: NOT DETECTED
COCAINE: NOT DETECTED
OPIATES: NOT DETECTED
Tetrahydrocannabinol: NOT DETECTED

## 2017-06-13 SURGERY — IRRIGATION AND DEBRIDEMENT EXTREMITY
Anesthesia: General | Site: Arm Lower | Laterality: Right

## 2017-06-13 MED ORDER — LIDOCAINE HCL (CARDIAC) 20 MG/ML IV SOLN
INTRAVENOUS | Status: DC | PRN
  Administered 2017-06-13: 30 mg via INTRAVENOUS

## 2017-06-13 MED ORDER — VANCOMYCIN HCL IN DEXTROSE 1-5 GM/200ML-% IV SOLN
1000.0000 mg | Freq: Two times a day (BID) | INTRAVENOUS | Status: DC
  Administered 2017-06-13 – 2017-06-16 (×7): 1000 mg via INTRAVENOUS
  Filled 2017-06-13 (×8): qty 200

## 2017-06-13 MED ORDER — PROPOFOL 10 MG/ML IV BOLUS
INTRAVENOUS | Status: DC | PRN
  Administered 2017-06-13: 170 mg via INTRAVENOUS

## 2017-06-13 MED ORDER — VANCOMYCIN HCL IN DEXTROSE 1-5 GM/200ML-% IV SOLN
1000.0000 mg | Freq: Once | INTRAVENOUS | Status: AC
  Administered 2017-06-13: 1000 mg via INTRAVENOUS
  Filled 2017-06-13: qty 200

## 2017-06-13 MED ORDER — PREGABALIN 75 MG PO CAPS
150.0000 mg | ORAL_CAPSULE | Freq: Two times a day (BID) | ORAL | Status: DC
  Administered 2017-06-13 – 2017-06-15 (×5): 150 mg via ORAL
  Filled 2017-06-13 (×6): qty 2

## 2017-06-13 MED ORDER — ONDANSETRON HCL 4 MG/2ML IJ SOLN: 4.0000 mg | Freq: Four times a day (QID) | INTRAMUSCULAR | Status: DC | PRN

## 2017-06-13 MED ORDER — FENTANYL CITRATE (PF) 100 MCG/2ML IJ SOLN
INTRAMUSCULAR | Status: DC | PRN
  Administered 2017-06-13: 25 ug via INTRAVENOUS
  Administered 2017-06-13: 50 ug via INTRAVENOUS
  Administered 2017-06-13: 25 ug via INTRAVENOUS

## 2017-06-13 MED ORDER — OXYCODONE HCL 5 MG PO TABS: 5.0000 mg | ORAL_TABLET | Freq: Once | ORAL | Status: DC | PRN

## 2017-06-13 MED ORDER — LACTATED RINGERS IV SOLN
INTRAVENOUS | Status: DC | PRN
  Administered 2017-06-13: 17:00:00 via INTRAVENOUS

## 2017-06-13 MED ORDER — MIDAZOLAM HCL 2 MG/2ML IJ SOLN
INTRAMUSCULAR | Status: AC
  Filled 2017-06-13: qty 2

## 2017-06-13 MED ORDER — MORPHINE SULFATE (PF) 4 MG/ML IV SOLN
2.0000 mg | INTRAVENOUS | Status: DC | PRN
  Administered 2017-06-13 – 2017-06-16 (×13): 4 mg via INTRAVENOUS
  Administered 2017-06-16: 2 mg via INTRAVENOUS
  Filled 2017-06-13 (×14): qty 1

## 2017-06-13 MED ORDER — 0.9 % SODIUM CHLORIDE (POUR BTL) OPTIME
TOPICAL | Status: DC | PRN
  Administered 2017-06-13: 1000 mL

## 2017-06-13 MED ORDER — DIPHENHYDRAMINE HCL 25 MG PO CAPS
25.0000 mg | ORAL_CAPSULE | Freq: Four times a day (QID) | ORAL | Status: DC | PRN
  Administered 2017-06-13 – 2017-06-14 (×2): 25 mg via ORAL
  Filled 2017-06-13 (×3): qty 1

## 2017-06-13 MED ORDER — OXYCODONE HCL 5 MG PO TABS
5.0000 mg | ORAL_TABLET | ORAL | Status: DC | PRN
  Administered 2017-06-13 – 2017-06-14 (×4): 10 mg via ORAL
  Administered 2017-06-14: 5 mg via ORAL
  Administered 2017-06-14 – 2017-06-16 (×13): 10 mg via ORAL
  Filled 2017-06-13 (×18): qty 2

## 2017-06-13 MED ORDER — VITAMIN C 500 MG PO TABS
1000.0000 mg | ORAL_TABLET | Freq: Every day | ORAL | Status: DC
  Administered 2017-06-13 – 2017-06-15 (×3): 1000 mg via ORAL
  Filled 2017-06-13 (×4): qty 2

## 2017-06-13 MED ORDER — OXYCODONE HCL 5 MG/5ML PO SOLN: 5.0000 mg | Freq: Once | ORAL | Status: DC | PRN

## 2017-06-13 MED ORDER — SODIUM CHLORIDE 0.9 % IR SOLN
Status: DC | PRN
  Administered 2017-06-13: 3000 mL

## 2017-06-13 MED ORDER — PROPOFOL 10 MG/ML IV BOLUS
INTRAVENOUS | Status: AC
  Filled 2017-06-13: qty 20

## 2017-06-13 MED ORDER — HYDROMORPHONE HCL 1 MG/ML IJ SOLN: 0.2500 mg | INTRAMUSCULAR | Status: DC | PRN

## 2017-06-13 MED ORDER — MIDAZOLAM HCL 5 MG/5ML IJ SOLN
INTRAMUSCULAR | Status: DC | PRN
  Administered 2017-06-13 (×2): 1 mg via INTRAVENOUS

## 2017-06-13 MED ORDER — BISACODYL 10 MG RE SUPP: 10.0000 mg | Freq: Every day | RECTAL | Status: DC | PRN

## 2017-06-13 MED ORDER — POTASSIUM CHLORIDE IN NACL 20-0.45 MEQ/L-% IV SOLN
INTRAVENOUS | Status: DC
  Administered 2017-06-13 (×2): via INTRAVENOUS
  Filled 2017-06-13 (×3): qty 1000

## 2017-06-13 MED ORDER — ONDANSETRON HCL 4 MG PO TABS: 4.0000 mg | ORAL_TABLET | Freq: Four times a day (QID) | ORAL | Status: DC | PRN

## 2017-06-13 MED ORDER — POLYETHYLENE GLYCOL 3350 17 G PO PACK: 17.0000 g | PACK | Freq: Every day | ORAL | Status: DC | PRN

## 2017-06-13 MED ORDER — MAGNESIUM CITRATE PO SOLN: 1.0000 | Freq: Once | ORAL | Status: DC | PRN

## 2017-06-13 MED ORDER — LACTATED RINGERS IV SOLN: INTRAVENOUS | Status: DC

## 2017-06-13 MED ORDER — FAMOTIDINE 20 MG PO TABS: 20.0000 mg | ORAL_TABLET | Freq: Two times a day (BID) | ORAL | Status: DC | PRN

## 2017-06-13 MED ORDER — DOCUSATE SODIUM 100 MG PO CAPS
100.0000 mg | ORAL_CAPSULE | Freq: Two times a day (BID) | ORAL | Status: DC
Start: 2017-06-13 — End: 2017-06-16
  Administered 2017-06-13 – 2017-06-15 (×6): 100 mg via ORAL
  Filled 2017-06-13 (×7): qty 1

## 2017-06-13 MED ORDER — PROMETHAZINE HCL 25 MG RE SUPP: 12.5000 mg | Freq: Four times a day (QID) | RECTAL | Status: DC | PRN

## 2017-06-13 MED ORDER — FENTANYL CITRATE (PF) 250 MCG/5ML IJ SOLN
INTRAMUSCULAR | Status: AC
  Filled 2017-06-13: qty 5

## 2017-06-13 MED ORDER — SODIUM CHLORIDE 0.9 % IV SOLN
1.5000 g | Freq: Four times a day (QID) | INTRAVENOUS | Status: DC
  Administered 2017-06-13 – 2017-06-16 (×13): 1.5 g via INTRAVENOUS
  Filled 2017-06-13 (×18): qty 1.5

## 2017-06-13 MED ORDER — SODIUM CHLORIDE 0.9 % IV SOLN
1.5000 g | INTRAVENOUS | Status: AC
  Administered 2017-06-13: 1.5 g via INTRAVENOUS
  Filled 2017-06-13: qty 1.5

## 2017-06-13 MED ORDER — ONDANSETRON HCL 4 MG/2ML IJ SOLN
INTRAMUSCULAR | Status: DC | PRN
  Administered 2017-06-13: 4 mg via INTRAVENOUS

## 2017-06-13 MED ORDER — SENNA 8.6 MG PO TABS
1.0000 | ORAL_TABLET | Freq: Two times a day (BID) | ORAL | Status: DC
  Administered 2017-06-13 – 2017-06-15 (×4): 8.6 mg via ORAL
  Filled 2017-06-13 (×5): qty 1

## 2017-06-13 SURGICAL SUPPLY — 42 items
BANDAGE ACE 4X5 VEL STRL LF (GAUZE/BANDAGES/DRESSINGS) ×3 IMPLANT
BANDAGE ELASTIC 4 VELCRO ST LF (GAUZE/BANDAGES/DRESSINGS) ×2 IMPLANT
BNDG CONFORM 2 STRL LF (GAUZE/BANDAGES/DRESSINGS) IMPLANT
BNDG GAUZE ELAST 4 BULKY (GAUZE/BANDAGES/DRESSINGS) ×9 IMPLANT
CORDS BIPOLAR (ELECTRODE) ×3 IMPLANT
CUFF TOURNIQUET SINGLE 18IN (TOURNIQUET CUFF) ×3 IMPLANT
CUFF TOURNIQUET SINGLE 24IN (TOURNIQUET CUFF) IMPLANT
DRESSING ADAPTIC 1/2  N-ADH (PACKING) ×2 IMPLANT
DRSG ADAPTIC 3X8 NADH LF (GAUZE/BANDAGES/DRESSINGS) ×3 IMPLANT
GAUZE SPONGE 4X4 12PLY STRL (GAUZE/BANDAGES/DRESSINGS) ×3 IMPLANT
GAUZE SPONGE 4X4 12PLY STRL LF (GAUZE/BANDAGES/DRESSINGS) ×2 IMPLANT
GAUZE XEROFORM 1X8 LF (GAUZE/BANDAGES/DRESSINGS) ×3 IMPLANT
GLOVE BIOGEL M 8.0 STRL (GLOVE) ×3 IMPLANT
GLOVE SS BIOGEL STRL SZ 8 (GLOVE) ×1 IMPLANT
GLOVE SUPERSENSE BIOGEL SZ 8 (GLOVE) ×2
GOWN STRL REUS W/ TWL LRG LVL3 (GOWN DISPOSABLE) ×1 IMPLANT
GOWN STRL REUS W/ TWL XL LVL3 (GOWN DISPOSABLE) ×2 IMPLANT
GOWN STRL REUS W/TWL LRG LVL3 (GOWN DISPOSABLE) ×3
GOWN STRL REUS W/TWL XL LVL3 (GOWN DISPOSABLE) ×6
HANDPIECE INTERPULSE COAX TIP (DISPOSABLE)
KIT BASIN OR (CUSTOM PROCEDURE TRAY) ×3 IMPLANT
KIT ROOM TURNOVER OR (KITS) ×3 IMPLANT
MANIFOLD NEPTUNE II (INSTRUMENTS) ×3 IMPLANT
NDL HYPO 25GX1X1/2 BEV (NEEDLE) IMPLANT
NEEDLE HYPO 25GX1X1/2 BEV (NEEDLE) IMPLANT
NS IRRIG 1000ML POUR BTL (IV SOLUTION) ×3 IMPLANT
PACK ORTHO EXTREMITY (CUSTOM PROCEDURE TRAY) ×3 IMPLANT
PAD ARMBOARD 7.5X6 YLW CONV (MISCELLANEOUS) ×3 IMPLANT
PAD CAST 4YDX4 CTTN HI CHSV (CAST SUPPLIES) ×1 IMPLANT
PADDING CAST COTTON 4X4 STRL (CAST SUPPLIES) ×3
SCRUB BETADINE 4OZ XXX (MISCELLANEOUS) ×3 IMPLANT
SET HNDPC FAN SPRY TIP SCT (DISPOSABLE) IMPLANT
SOL PREP POV-IOD 4OZ 10% (MISCELLANEOUS) ×3 IMPLANT
SPONGE LAP 4X18 X RAY DECT (DISPOSABLE) ×3 IMPLANT
SWAB CULTURE ESWAB REG 1ML (MISCELLANEOUS) IMPLANT
SYR CONTROL 10ML LL (SYRINGE) IMPLANT
TOWEL OR 17X24 6PK STRL BLUE (TOWEL DISPOSABLE) ×3 IMPLANT
TOWEL OR 17X26 10 PK STRL BLUE (TOWEL DISPOSABLE) ×3 IMPLANT
TUBE CONNECTING 12'X1/4 (SUCTIONS) ×1
TUBE CONNECTING 12X1/4 (SUCTIONS) ×2 IMPLANT
WATER STERILE IRR 1000ML POUR (IV SOLUTION) ×3 IMPLANT
YANKAUER SUCT BULB TIP NO VENT (SUCTIONS) ×3 IMPLANT

## 2017-06-13 NOTE — Progress Notes (Signed)
Leaving floor via bed to OR. Report called to [phone]

## 2017-06-13 NOTE — Progress Notes (Signed)
Pharmacy Antibiotic Note  Albert Evans is a 46 y.o. male admitted on 06/12/2017 with moderate to severe hand infection w/ known h/o MRSA.  Pharmacy has been consulted for Vancocin and Unasyn dosing.  Plan: Vancomycin 1000mg  IV every 12 hours.  Goal trough 10-15 mcg/mL.  Unasyn 1.5g IV every 6 hours.  Height: 5\' 10"  (177.8 cm) Weight: 155 lb (70.3 kg) IBW/kg (Calculated) : 73  Temp (24hrs), Avg:99 F (37.2 C), Min:98.4 F (36.9 C), Max:99.5 F (37.5 C)   Recent Labs Lab 06/12/17 2316  WBC 8.0     Allergies  Allergen Reactions  . Fish-Derived Products Anaphylaxis and Rash  . Shellfish-Derived Products Anaphylaxis and Rash  . Flexeril [Cyclobenzaprine Hcl] Rash    Makes the patient lethargic also  . Robaxin [Methocarbamol] Rash    Makes the patient lethargic also    Thank you for allowing pharmacy to be a part of this patient's care.  Albert Evans, PharmD, BCPS  06/13/2017 12:48 AM
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
## 2017-06-13 NOTE — Anesthesia Procedure Notes (Signed)
Procedure Name: LMA Insertion Date/Time: 06/13/2017 4:51 PM Performed by: Edmonia CaprioAUSTON, Davinci Glotfelty M Pre-anesthesia Checklist: Patient identified, Suction available, Emergency Drugs available, Patient being monitored and Timeout performed Patient Re-evaluated:Patient Re-evaluated prior to inductionOxygen Delivery Method: Circle system utilized Preoxygenation: Pre-oxygenation with 100% oxygen Intubation Type: IV induction Ventilation: Mask ventilation without difficulty LMA: LMA inserted LMA Size: 4.0 Tube type: Oral Number of attempts: 1 Placement Confirmation: breath sounds checked- equal and bilateral and positive ETCO2 Tube secured with: Tape Dental Injury: Teeth and Oropharynx as per pre-operative assessment

## 2017-06-13 NOTE — Progress Notes (Signed)
Resting in bed, sleeping a lot, respirations are even and unlabored, color good, easy to arouse. No signs of distress. Continuing to monitor
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
## 2017-06-13 NOTE — Progress Notes (Signed)
Returned to floor via bed, accompanied by PACU, RN.; patient is drowsy, easily arousable then back to sleep. No signs of distress. Respirations are even and unlabored @ 16 BPM

## 2017-06-13 NOTE — Transfer of Care (Signed)
Immediate Anesthesia Transfer of Care Note  Patient: Albert Evans  Procedure(s) Performed: Procedure(s): IRRIGATION AND DEBRIDEMENT RIGHT HAND (Right)  Patient Location: PACU  Anesthesia Type:General  Level of Consciousness: sedated  Airway & Oxygen Therapy: Patient Spontanous Breathing and Patient connected to nasal cannula oxygen  Post-op Assessment: Report given to RN and Post -op Vital signs reviewed and stable  Post vital signs: Reviewed and stable  Last Vitals:  Vitals:   06/13/17 0230 06/13/17 0320  BP: 116/76 118/68  Pulse: 63 64  Resp: 14   Temp:  36.8 C    Last Pain:  Vitals:   06/13/17 0811  TempSrc:   PainSc: Asleep      Patients Stated Pain Goal: 3 (06/13/17 0409)  Complications: No apparent anesthesia complications

## 2017-06-13 NOTE — Op Note (Signed)
See dictation [id number]  Status post irrigation and debridement deep abscess and radical extensor tendon tenolysis tenosynovectomy secondary to infectious tenosynovitis  We'll plan for IV antibiotics observation and advanced to [employer]/whirlpool with wet-to-dry dressing changes  All questions haven't encouraged and answered.  Rina Adney M.D.

## 2017-06-13 NOTE — H&P (Signed)
Albert Evans is an 46 y.o. male.   Chief Complaint: Right hand swelling HPI: History of right hand swelling 5 days ago. He denies fight bite he denies trauma then work-related laceration. He and I discussed all issues at length. He presents with a hand that is swollen and has erythema and some saline change. I do feel he likely has some evolving deep abscess formation.  I recommend IV antibiotics evaluate the cellulitis and in proceed with surgical I and D in the hours ahead based upon his response. He understands this.  Past Medical History:  Diagnosis Date  . Ruptured disc, cervical     Past Surgical History:  Procedure Laterality Date  . IRRIGATION AND DEBRIDEMENT ABSCESS  01/25/2012   Procedure: IRRIGATION AND DEBRIDEMENT ABSCESS;  Surgeon: Lindaann Slough, MD;  Location: [facility] OR;  Service: Urology;  Laterality: N/A;  . NECK SURGERY      No family history on file. Social History:  reports that he has been smoking Cigarettes.  He has a 2.50 pack-year smoking history. He has never used smokeless tobacco. He reports that he drinks alcohol. He reports that he uses drugs, including Marijuana.  Allergies:  Allergies  Allergen Reactions  . Fish-Derived Products Anaphylaxis and Rash  . Shellfish-Derived Products Anaphylaxis and Rash  . Flexeril [Cyclobenzaprine Hcl] Rash    Makes the patient lethargic also  . Robaxin [Methocarbamol] Rash    Makes the patient lethargic also     (Not in a hospital admission)  Results for orders placed or performed during the hospital encounter of 06/12/17 (from the past 48 hour(s))  CBC     Status: Abnormal   Collection Time: 06/12/17 11:16 PM  Result Value Ref Range   WBC 8.0 4.0 - 10.5 K/uL   RBC 4.34 4.22 - 5.81 MIL/uL   Hemoglobin 12.7 (L) 13.0 - 17.0 g/dL   HCT 40.9 (L) 81.1 - 91.4 %   MCV 89.4 78.0 - 100.0 fL   MCH 29.3 26.0 - 34.0 pg   MCHC 32.7 30.0 - 36.0 g/dL   RDW 78.2 95.6 - 21.3 %   Platelets 271 150 - 400 K/uL   Dg Hand  Complete Right  Result Date: 06/12/2017 CLINICAL DATA:  46 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 Abnormal second third and fourth fingers on exam. EXAM: RIGHT HAND - COMPLETE 3+ VIEW COMPARISON:  None. FINDINGS: Bone mineralization is within normal limits. Distal right radius and ulna appear intact. 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 Metacarpals appear intact, probable healed remote distal fifth metacarpal fracture. Soft tissue swelling second third and fourth fingers. No subcutaneous gas. No radiopaque foreign body identified. No phalanx fracture or dislocation identified. IMPRESSION: 1.  No acute fracture or dislocation identified in the right hand. 2. Soft tissue swelling second through fourth fingers with no subcutaneous gas or radiopaque foreign body identified. Electronically Signed   By: Odessa Fleming M.D.   On: 06/12/2017 19:12    Review of Systems  Constitutional: Negative.   Respiratory: Negative.   Cardiovascular: Negative.   Gastrointestinal: Negative.   Genitourinary: Negative.     Blood pressure (!) 141/76, pulse 91, temperature 99.5 F (37.5 C), temperature source Oral, resp. rate 18, height 5\' 10"  (1.778 m), weight 70.3 kg (155 lb), SpO2 95 %. Physical Exam  Alert and oriented in no acute distress  Patient has an area over the PIP and an area over the MCP  where there is obvious cellulitic change rule out abscess. He is sensate he can move his fingers. Flexor apparatus appears to be intact there is no signs of instability or dystrophy. Patient has no signs of compartment syndrome or necrotizing fasciitis. I reviewed all issues with him at length and the findings.  The patient is alert and oriented in no acute distress. The patient complains of pain in the affected upper extremity.  The patient is noted to have a normal HEENT exam. Lung fields show equal chest expansion and no  shortness of breath. Abdomen exam is nontender without distention. Lower extremity examination does not show any fracture dislocation or blood clot symptoms. Pelvis is stable and the neck and back are stable and nontender. Assessment/Plan  Cellulitis right hand after stabbing injury 5 days ago. This involves the dorsal aspect. [name]  We will admit him for IV antibiotics and plan for surgical irrigation and debridement in the operative theater tomorrow morning which will be 06/13/2017  All questions have been encouraged and answered.  Patient and I had a long conversation about many and all issues. To say he is a poor historian's and under statement. He denies IV drug abuse he denies any type of injury that he recalls other than his work 5 days ago that may of incited this issue.   We'll proceed accordingly.  We are planning surgery for your upper extremity. The risk and benefits of surgery to include risk of bleeding, infection, anesthesia,  damage to normal structures and failure of the surgery to accomplish its intended goals of relieving symptoms and restoring function have been discussed in detail. With this in mind we plan to proceed. I have specifically discussed with the patient the pre-and postoperative regime and the dos and don'ts and risk and benefits in great detail. Risk and benefits of surgery also include risk of dystrophy(CRPS), chronic nerve pain, failure of the healing process to go onto completion and other inherent risks of surgery The relavent the pathophysiology of the disease/injury process, as well as the alternatives for treatment and postoperative course of action has been discussed in great detail with the patient who desires to proceed.  We will do everything in our power to help you (the patient) restore function to the upper extremity. It is a pleasure to see this patient today.  Karen ChafeGRAMIG III,Nahmir Zeidman M, MD 06/13/2017, 12:22 AM

## 2017-06-14 NOTE — Progress Notes (Signed)
Patient ID: Montez MoritaRonney D Seto, male   DOB: 18-Jan-1971, 46 y.o.   MRN: [id number] And oriented.  He has pain appropriate to his infection process.  We're awaiting cultures.  We will await PT for hydrotherapy and transition to wet-to-dry home dressing change program on appropriate by mouth antibiotics once the cultures return.  We have discussed with the patient the issues regarding their infection to the extremity. We will continue antibiotics and await culture results. Often times it will take 3-5 days for cultures to become final. During this time we will typically have the patient on intravenous antibiotics until we can find a parenteral route of antibiotic regime specific for the bacteria or organism isolated. We have discussed with the patient the need for daily irrigation and debridement as well as therapy to the area. We have discussed with the patient the necessity of range of motion to the involved joints as discussed today. We have discussed with the patient the unpredictability of infections at times. We'll continue to work towards good pain control and restoration of function. The patient understands the need for meticulous wound care and the necessity of proper followup.  The possible complications of stiffness (loss of motion), resistant infection, possible deep bone infection, possible chronic pain issues, possible need for multiple surgeries and even amputation.  With this in mind the patient understands our goal is to eradicate the infection to quiesence. We will continue to work towards these goals.  Drevon Plog M.D.

## 2017-06-14 NOTE — Op Note (Signed)
[name]:  [name], [name]               ACCOUNT NO.:  [id number]  MEDICAL RECORD NO.:  [id number]  LOCATION:  5N11C                        FACILITY:  [facility]  PHYSICIAN:  Dionne AnoWilliam M. Audiel Scheiber, M.D.DATE OF BIRTH:  07/26/1971  DATE OF PROCEDURE: 06/13/2017 DATE OF DISCHARGE: To be determined                              OPERATIVE REPORT   PREOPERATIVE DIAGNOSIS:  Right middle finger abscess with infectious tenosynovitis.  POSTOPERATIVE DIAGNOSIS:  Right middle finger abscess with infectious tenosynovitis.  PROCEDURE: 1. Irrigation, debridement, right middle finger and hand, including     skin, subcutaneous tissue, tendon, and deep structures. 2. Radical extensor tenolysis, tenosynovectomy secondary to infectious     tenosynovitis.  SURGEON:  Dominica SeverinWilliam Pearley Baranek, M.D.  ASSISTANT:  None.  COMPLICATIONS:  None.  ANESTHESIA:  General.  TOURNIQUET TIME:  Less than an hour.  INDICATIONS:  The patient is a pleasant male, who presents with the above-mentioned diagnosis.  He had an abscess.  He desires to proceed with surgery.  I have counseled him in regard to risks and benefits of surgery.  OPERATIVE PROCEDURE:  The patient was seen by myself and Anesthesia. Taken to the operative theater and underwent a smooth induction of anesthesia in the form of general anesthetic.  He was prepped and draped in a sterile fashion, underwent a very careful and cautious approach to the extremity with incision along the PIP joint.  Another incision was made along the MCP joint.  I dissected down and made another incision through a sinus tract.  Immediate purulence was encountered.  We performed irrigation and debridement of skin, subcutaneous tissue, tendon, and deep structures.  This was an abscess and aerobic and anaerobic cultures were taken.  Following this, I performed a radical tenolysis, tenosynovectomy of the extensor apparatus.  The patient tolerated this well.  There were no complicating  features.  Once this was complete, the patient then underwent placement of drains followed by careful and cautious wrapping with sterile gauze.  Adaptic, Xeroform, sterile gauze, and associated soft tissue coverage were placed.  He tolerated the procedure well, will be monitored in the recovery room, discharged to the floor.  We will plan for IV antibiotics, await cultures, and move forward with continued observatory care according to our protocol.  All questions have been encouraged and answered.  These notes discussed.  At the present time, the patient will hopefully do quite well with the above-mentioned surgical endeavors.  We will plan for hydrotherapy and whirlpool and ready him for wet-to-dry dressing changes into the future. Once again, await cultures.  Plan antibiotics.  No further washouts needed.  We will begin hydrotherapy for Waterpik.  We will perform wet- to-dry dressing changes.     Dionne AnoWilliam M. Amanda PeaGramig, M.D.     [facility]/MEDQ  D:  06/13/2017  T:  06/14/2017  Job:  [id number]

## 2017-06-14 NOTE — Progress Notes (Signed)
Patient was asking about his jewelry that was removed in OR. Stated it was placed in his chart. I checked the patient's chart and found a plastic ziplock containing his jewelry. I returned the bag of jewelry unopened to the patient.

## 2017-06-14 NOTE — Progress Notes (Signed)
PT Cancellation Note  Patient Details Name: Albert Evans MRN: [id number] DOB: 1970-12-19   Cancelled Treatment:    Reason Eval/Treat Not Completed: Other (comment) (Hydro orders received, will begin 7/2)   Fabio AsaDevon J Tya Haughey 06/14/2017, 8:08 AM Charlotte Crumbevon Edgar Corrigan, PT DPT [facility] [phone]

## 2017-06-15 ENCOUNTER — Encounter (HOSPITAL_COMMUNITY): Payer: Self-pay | Admitting: Orthopedic Surgery

## 2017-06-15 NOTE — Progress Notes (Signed)
Physical Therapy Wound Treatment Patient Details  Name: Albert Evans MRN: [id number] Date of Birth: Dec 14, 1971  Today's Date: 06/15/2017 Time: 1225-1250 Time Calculation (min): 25 min  Subjective  Subjective: He just changed the dressing. Patient and Family Stated Goals: healed up and not hurting  Pain Score:    Wound Assessment  Wound / Incision (Open or Dehisced) 06/15/17 Other (Comment) Finger (Comment which one) Right infected wound dorsum of R middle finger (Active)  Dressing Type Compression wrap;Gauze (Comment);Moist to dry 06/15/2017  1:00 PM  Dressing Changed Changed 06/15/2017  1:00 PM  Dressing Status Clean;Dry;Intact 06/15/2017  1:00 PM  Dressing Change Frequency Daily 06/15/2017  1:00 PM  Site / Wound Assessment Red;Granulation tissue 06/15/2017  1:00 PM  % Wound base Red or Granulating 100% 06/15/2017  1:00 PM  % Wound base Yellow/Fibrinous Exudate 0% 06/15/2017  1:00 PM  % Wound base Black/Eschar 0% 06/15/2017  1:00 PM  % Wound base Other/Granulation Tissue (Comment) 0% 06/15/2017  1:00 PM  Wound Length (cm) 0.5 cm 06/15/2017  1:00 PM  Wound Width (cm) 0.9 cm 06/15/2017  1:00 PM  Wound Depth (cm) 0.2 cm 06/15/2017  1:00 PM  Drainage Amount Minimal 06/15/2017  1:00 PM  Drainage Description Serosanguineous 06/15/2017  1:00 PM  Treatment Cleansed;Debridement (Selective);Hydrotherapy (Pulse lavage) 06/15/2017  1:00 PM     Incision (Closed) 06/13/17 Arm Right (Active)  Dressing Type Compression wrap;Gauze (Comment) 06/15/2017  1:00 PM  Dressing Clean;Dry;Intact 06/15/2017  1:00 PM  Dressing Change Frequency Daily 06/15/2017  1:00 PM  Site / Wound Assessment Dry 06/15/2017  1:00 PM  Incision Length (cm) 1.3 cm 06/15/2017  1:00 PM  Margins Attached edges (approximated) 06/15/2017  1:00 PM  Closure Approximated 06/15/2017  1:00 PM  Drainage Amount None 06/15/2017  1:00 PM  Treatment Cleansed 06/15/2017  1:00 PM   Hydrotherapy Pulsed lavage therapy - wound location: wound on dorsal surface of R 3rd finger with  ?drain hole medial to main wound. Pulsed Lavage with Suction (psi): 4 psi Pulsed Lavage with Suction - Normal Saline Used: 1000 mL Pulsed Lavage Tip: Tip with splash shield Selective Debridement Selective Debridement - Location: R 3rd finger wound.   Selective Debridement - Tools Used: Forceps Selective Debridement - Tissue Removed: trash--necrotic tissue not adhered.   Wound Assessment and Plan  Wound Therapy - Assess/Plan/Recommendations Wound Therapy - Clinical Statement: pt will benefit most from PLS to cleanse, flush and decrease bio burden of the wound.  Pt will also need to be able to direct his dressing change which we will complete next session Wound Therapy - Functional Problem List: presently pt has decreased 3rd finger ROM and decreased overal hand function. Factors Delaying/Impairing Wound Healing: Infection - systemic/local Hydrotherapy Plan: Debridement;Dressing change;Pulsatile lavage with suction Wound Therapy - Frequency: 6X / week Wound Therapy - Follow Up Recommendations: Home health RN Wound Plan: see above  Wound Therapy Goals- Improve the function of patient's integumentary system by progressing the wound(s) through the phases of wound healing (inflammation - proliferation - remodeling) by: Decrease Necrotic Tissue to: 0% Decrease Necrotic Tissue - Progress: Goal set today Increase Granulation Tissue to: 100% Increase Granulation Tissue - Progress: Goal set today Improve Drainage Characteristics: Min;Serous Improve Drainage Characteristics - Progress: Goal set today Patient/Family will be able to : direct someone to change dressing correctly Patient/Family Instruction Goal - Progress: Goal set today Time For Goal Achievement: 7 days Wound Therapy - Potential for Goals: Good  Goals will be updated until maximal potential achieved  or discharge criteria met.  Discharge criteria: when goals achieved, discharge from hospital, MD decision/surgical intervention, no  progress towards goals, refusal/missing three consecutive treatments without notification or medical reason.  GP     Tessie Fass Immanuel Fedak 06/15/2017, 1:16 PM 06/15/2017  Donnella Sham, [place] [phone]  (pager)

## 2017-06-15 NOTE — Progress Notes (Signed)
Patient ID: Albert Evans, male   DOB: 01-28-1971, 46 y.o.   MRN: [id number] Patient alert and oriented.  Out again his dressing and performed a bedside I and D with saline. This is debridement of skin is subtendinous tissue which he tolerated well.  I would like him to be seen by whirlpool today [facility] and dressing changes. His cultures showed a few staph aureus we are awaiting susceptibilities.  At present time I discussed with patient will plan for hydrotherapy today and tomorrow in DC home tomorrow after cultures are final.  All questions have been encouraged and answered he is doing relatively well.  Deshunda Thackston M.D.

## 2017-06-15 NOTE — Anesthesia Postprocedure Evaluation (Signed)
Anesthesia Post Note  Patient: Albert Evans  Procedure(s) Performed: Procedure(s) (LRB): IRRIGATION AND DEBRIDEMENT RIGHT HAND (Right)     Patient location during evaluation: PACU Anesthesia Type: General Level of consciousness: awake and alert Pain management: pain level controlled Vital Signs Assessment: post-procedure vital signs reviewed and stable Respiratory status: spontaneous breathing, nonlabored ventilation, respiratory function stable and patient connected to nasal cannula oxygen Cardiovascular status: blood pressure returned to baseline and stable Postop Assessment: no signs of nausea or vomiting Anesthetic complications: no    Last Vitals:  Vitals:   06/14/17 2240 06/15/17 0700  BP: (!) 102/58 (!) 114/50  Pulse: 60 (!) 57  Resp:    Temp: 37.1 C 37 C    Last Pain:  Vitals:   06/15/17 0700  TempSrc: Oral  PainSc:                  Hammond Obeirne

## 2017-06-16 LAB — BASIC METABOLIC PANEL
ANION GAP: 8 (ref 5–15)
BUN: 12 mg/dL (ref 6–20)
CHLORIDE: 104 mmol/L (ref 101–111)
CO2: 27 mmol/L (ref 22–32)
CREATININE: 1.1 mg/dL (ref 0.61–1.24)
Calcium: 9.2 mg/dL (ref 8.9–10.3)
GFR calc non Af Amer: >60 mL/min (ref >60)
Glucose, Bld: 94 mg/dL (ref 65–99)
POTASSIUM: 4.4 mmol/L (ref 3.5–5.1)
SODIUM: 139 mmol/L (ref 135–145)

## 2017-06-16 NOTE — Discharge Summary (Signed)
Physician Discharge Summary  Patient ID: Albert MoritaRonney D Hon MRN: [id number] DOB/AGE: October 20, 1971 46 y.o.  Admit date: 06/12/2017 Discharge date:   Admission Diagnoses: right hand abscess Past Medical History:  Diagnosis Date  . Ruptured disc, cervical     Discharge Diagnoses:  Active Problems:   Cellulitis   Surgeries: Procedure(s): IRRIGATION AND DEBRIDEMENT RIGHT HAND on 06/12/2017 - 06/13/2017    Consultants:   Discharged Condition: Improved  Hospital Course: Albert Evans is an 46 y.o. male who was admitted 06/12/2017 with a chief complaint of Chief Complaint  Patient presents with  . Hand Pain  , and found to have a diagnosis of right hand abscess.  They were brought to the operating room on 06/12/2017 - 06/13/2017 and underwent Procedure(s): IRRIGATION AND DEBRIDEMENT RIGHT HAND.    They were given perioperative antibiotics: Anti-infectives    Start     Dose/Rate Route Frequency Ordered Stop   06/13/17 1615  ampicillin-sulbactam (UNASYN) 1.5 g in sodium chloride 0.9 % 50 mL IVPB     1.5 g 100 mL/hr over 30 Minutes Intravenous To Surgery 06/13/17 1610 06/13/17 1716   06/13/17 1000  vancomycin (VANCOCIN) IVPB 1000 mg/200 mL premix     1,000 mg 200 mL/hr over 60 Minutes Intravenous Every 12 hours 06/13/17 0053     06/13/17 0100  vancomycin (VANCOCIN) IVPB 1000 mg/200 mL premix     1,000 mg 200 mL/hr over 60 Minutes Intravenous  Once 06/13/17 0053 06/13/17 0219   06/13/17 0100  ampicillin-sulbactam (UNASYN) 1.5 g in sodium chloride 0.9 % 50 mL IVPB     1.5 g 100 mL/hr over 30 Minutes Intravenous Every 6 hours 06/13/17 0053      .  They were given sequential compression devices, early ambulation, and Other (comment) for DVT prophylaxis.  Recent vital signs: Patient Vitals for the past 24 hrs:  BP Temp Temp src Pulse Resp SpO2  06/16/17 0641 (!) 95/50 98.5 F (36.9 C) Oral (!) 51 18 100 %  06/16/17 0118 (!) 109/48 98.5 F (36.9 C) Oral (!) 59 16 100 %  .  Recent  laboratory studies: No results found.  Discharge Medications:   Allergies as of 06/16/2017      Reactions   Fish-derived Products Anaphylaxis, Rash   Shellfish-derived Products Anaphylaxis, Rash   Flexeril [cyclobenzaprine Hcl] Rash   Makes the patient lethargic also   Robaxin [methocarbamol] Rash   Makes the patient lethargic also      Medication List    TAKE these medications   ibuprofen 200 MG tablet Commonly known as:  ADVIL,MOTRIN Take 800-1,000 mg by mouth every 6 (six) hours as needed (for neck pain).   pregabalin 150 MG capsule Commonly known as:  LYRICA Take 150 mg by mouth 2 (two) times daily.       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 Abnormal second third and fourth fingers on exam. EXAM: RIGHT HAND - COMPLETE 3+ VIEW COMPARISON:  None. FINDINGS: Bone mineralization is within normal limits. Distal right radius and ulna appear intact. 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 Metacarpals appear intact, probable healed remote distal fifth metacarpal fracture. Soft tissue swelling second third and fourth fingers. No subcutaneous gas. No radiopaque foreign body identified. No phalanx fracture or dislocation identified. IMPRESSION: 1.  No acute fracture or dislocation identified in the right hand. 2.  Soft tissue swelling second through fourth fingers with no subcutaneous gas or radiopaque foreign body identified. Electronically Signed   By: Odessa Fleming M.D.   On: 06/12/2017 19:12    They benefited maximally from their hospital stay and there were no complications.     Disposition: 01-Home or Self Care Discharge Instructions    Call MD / Call 911    Complete by:  As directed    If you experience chest pain or shortness of breath, CALL 911 and be transported to the hospital emergency room.  If  you develope a fever above 101 F, pus (white drainage) or increased drainage or redness at the wound, or calf pain, call your surgeon's office.   Constipation Prevention    Complete by:  As directed    Drink plenty of fluids.  Prune juice may be helpful.  You may use a stool softener, such as Colace (over the counter) 100 mg twice a day.  Use MiraLax (over the counter) for constipation as needed.   Diet - low sodium heart healthy    Complete by:  As directed    Increase activity slowly as tolerated    Complete by:  As directed       status post irrigation and debridement abscess about the hand right upper extremity.  He'll be discharged him on doxycycline 100 mg twice a day 14 days. Norco when necessary pain dispensed #30 one to 2 every 4 hours.  All questions have been encouraged and answered.  He'll see [place] Tuesday, July 10 at 8 AM   Signed: Karen Chafe 06/16/2017, 3:47 PM

## 2017-06-16 NOTE — Progress Notes (Signed)
Patient ID: Albert MoritaRonney D Franqui, male   DOB: [birth year], 46 y.o.   MRN: [id number] Patient is doing reasonably well.  Vital signs stable.  Wound looks much improved without comp K and feature.  I discussed with him transition to oral antibiotics and DC home with RTC next week. I'll ask him to perform wet-to-dry dressing changes and notify me should any problems occur.  Final diagnosis abscess status post tendon debridement and lysis with abscess evacuation  Alayshia Marini M.D.

## 2017-06-16 NOTE — Discharge Instructions (Signed)
Please change her bandage daily as instructed. Please washer with soap and water and wet-to-dry bandage. We will see you Tuesday July 10 at 8 AM  We recommend that you to take vitamin C 1000 mg a day to promote healing. We also recommend that if you require  pain medicine that you take a stool softener to prevent constipation as most pain medicines will have constipation side effects. We recommend either Peri-Colace or Senokot and recommend that you also consider adding MiraLAX as well to prevent the constipation affects from pain medicine if you are required to use them. These medicines are over the counter and may be purchased at a local pharmacy. A cup of yogurt and a probiotic can also be helpful during the recovery process as the medicines can disrupt your intestinal environment. Keep bandage clean and dry.  Call for any problems.  No smoking.  Criteria for driving a car: you should be off your pain medicine for 7-8 hours, able to drive one handed(confident), thinking clearly and feeling able in your judgement to drive. Continue elevation as it will decrease swelling.  If instructed by MD move your fingers within the confines of the bandage/splint.  Use ice if instructed by your MD. Call immediately for any sudden loss of feeling in your hand/arm or change in functional abilities of the extremity.
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
## 2017-06-16 NOTE — Progress Notes (Addendum)
Physical Therapy Wound Treatment Patient Details  Name: Albert Evans MRN: [id number] Date of Birth: [birth year]  Today's Date: 06/16/2017 Time: 5278-0244 Time Calculation (min): 27 min  Subjective  Patient and Family Stated Goals: healed up and not hurting  Pain Score:   Pt instructed in dressing change.  He verbalized understanding. Wound Assessment  Wound / Incision (Open or Dehisced) 06/15/17 Other (Comment) Finger (Comment which one) Right infected wound dorsum of R middle finger (Active)  Dressing Type Compression wrap;Gauze (Comment);Moist to dry 06/16/2017 11:36 AM  Dressing Changed Changed 06/15/2017  1:00 PM  Dressing Status Clean;Dry;Intact 06/16/2017 11:36 AM  Dressing Change Frequency Daily 06/16/2017 11:36 AM  Site / Wound Assessment Red;Granulation tissue 06/16/2017 11:36 AM  % Wound base Red or Granulating 100% 06/16/2017 11:36 AM  % Wound base Yellow/Fibrinous Exudate 0% 06/16/2017 11:36 AM  % Wound base Black/Eschar 0% 06/16/2017 11:36 AM  % Wound base Other/Granulation Tissue (Comment) 0% 06/16/2017 11:36 AM  Wound Length (cm) 0.5 cm 06/15/2017  1:00 PM  Wound Width (cm) 0.9 cm 06/15/2017  1:00 PM  Wound Depth (cm) 0.2 cm 06/15/2017  1:00 PM  Drainage Amount Minimal 06/16/2017 11:36 AM  Drainage Description Serous 06/16/2017 11:36 AM  Treatment Cleansed;Debridement (Selective);Hydrotherapy (Pulse lavage) 06/15/2017  1:00 PM     Incision (Closed) 06/13/17 Arm Right (Active)  Dressing Type Compression wrap;Gauze (Comment) 06/16/2017 11:36 AM  Dressing Clean;Dry;Intact 06/16/2017 11:36 AM  Dressing Change Frequency Daily 06/16/2017 11:36 AM  Site / Wound Assessment Dry 06/16/2017 11:36 AM  Incision Length (cm) 1.3 cm 06/15/2017  1:00 PM  Margins Attached edges (approximated) 06/16/2017 11:36 AM  Closure Approximated 06/16/2017 11:36 AM  Drainage Amount None 06/16/2017 11:36 AM  Treatment Cleansed 06/15/2017  1:00 PM   Hydrotherapy Pulsed lavage therapy - wound location: wound on dorsal surface of R 3rd  finger with ?drain hole medial to main wound. Pulsed Lavage with Suction (psi): 4 psi Pulsed Lavage with Suction - Normal Saline Used: 1000 mL Pulsed Lavage Tip: Tip with splash shield Selective Debridement Selective Debridement - Location: R 3rd finger wound.   Selective Debridement - Tools Used: Forceps Selective Debridement - Tissue Removed: healthy looking, removed scabbed clot off top   Wound Assessment and Plan  Wound Therapy - Assess/Plan/Recommendations Wound Therapy - Clinical Statement: pt will benefit most from PLS to cleanse, flush and decrease bio burden of the wound.  Pt will also need to be able to direct his dressing change which we will complete next session Wound Therapy - Functional Problem List: presently pt has decreased 3rd finger ROM and decreased overal hand function. Factors Delaying/Impairing Wound Healing: Infection - systemic/local Hydrotherapy Plan: Debridement;Dressing change;Pulsatile lavage with suction Wound Therapy - Frequency: 6X / week Wound Therapy - Follow Up Recommendations: Home health RN Wound Plan: see above  Wound Therapy Goals- Improve the function of patient's integumentary system by progressing the wound(s) through the phases of wound healing (inflammation - proliferation - remodeling) by: Decrease Necrotic Tissue to: 0% Decrease Necrotic Tissue - Progress: Met Increase Granulation Tissue to: 100% Increase Granulation Tissue - Progress: Met Improve Drainage Characteristics: Min;Serous Improve Drainage Characteristics - Progress: Met Patient/Family will be able to : direct someone to change dressing correctly Patient/Family Instruction Goal - Progress: Met Time For Goal Achievement: 7 days Wound Therapy - Potential for Goals: Good  Goals will be updated until maximal potential achieved or discharge criteria met.  Discharge criteria: when goals achieved, discharge from hospital, MD decision/surgical intervention, no progress towards goals,  refusal/missing three consecutive treatments without notification or medical reason.  GP     Albert Evans 06/16/2017, 11:40 AM 06/16/2017  Donnella Sham, [place] [phone]  (pager)

## 2017-06-16 NOTE — Progress Notes (Signed)
Patient ID: Albert Evans, male   DOB: 19-Jul-1971, 46 y.o.   MRN: [id number] Room looks very good is no comp occasions.  He is set for DC.  Please see DC summary.  All questions have been encouraged and answered we'll see him back in 1 week.   Ariannah Arenson M.D.

## 2017-06-18 LAB — AEROBIC/ANAEROBIC CULTURE (SURGICAL/DEEP WOUND)

## 2017-06-18 LAB — AEROBIC/ANAEROBIC CULTURE W GRAM STAIN (SURGICAL/DEEP WOUND)
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
## 2018-04-28 ENCOUNTER — Encounter (HOSPITAL_COMMUNITY): Payer: Self-pay | Admitting: Emergency Medicine

## 2018-04-28 ENCOUNTER — Other Ambulatory Visit: Payer: Self-pay

## 2018-04-28 ENCOUNTER — Inpatient Hospital Stay (HOSPITAL_COMMUNITY)
Admission: EM | Admit: 2018-04-28 | Discharge: 2018-04-29 | DRG: 872 | Discharge status: Against Medical Advice | Payer: Medicaid Other | Attending: Internal Medicine | Admitting: Internal Medicine

## 2018-04-28 DIAGNOSIS — M5412 Radiculopathy, cervical region: Secondary | ICD-10-CM | POA: Diagnosis present

## 2018-04-28 DIAGNOSIS — L0291 Cutaneous abscess, unspecified: Secondary | ICD-10-CM

## 2018-04-28 DIAGNOSIS — F1510 Other stimulant abuse, uncomplicated: Secondary | ICD-10-CM | POA: Diagnosis present

## 2018-04-28 DIAGNOSIS — Z91013 Allergy to seafood: Secondary | ICD-10-CM

## 2018-04-28 DIAGNOSIS — A419 Sepsis, unspecified organism: Secondary | ICD-10-CM | POA: Diagnosis present

## 2018-04-28 DIAGNOSIS — Z888 Allergy status to other drugs, medicaments and biological substances status: Secondary | ICD-10-CM

## 2018-04-28 DIAGNOSIS — L0390 Cellulitis, unspecified: Secondary | ICD-10-CM

## 2018-04-28 DIAGNOSIS — A4101 Sepsis due to Methicillin susceptible Staphylococcus aureus: Principal | ICD-10-CM | POA: Diagnosis present

## 2018-04-28 DIAGNOSIS — L03114 Cellulitis of left upper limb: Secondary | ICD-10-CM | POA: Diagnosis present

## 2018-04-28 DIAGNOSIS — E872 Acidosis: Secondary | ICD-10-CM | POA: Diagnosis present

## 2018-04-28 DIAGNOSIS — G8929 Other chronic pain: Secondary | ICD-10-CM | POA: Diagnosis present

## 2018-04-28 DIAGNOSIS — L02414 Cutaneous abscess of left upper limb: Secondary | ICD-10-CM | POA: Diagnosis present

## 2018-04-28 DIAGNOSIS — R0602 Shortness of breath: Secondary | ICD-10-CM | POA: Diagnosis present

## 2018-04-28 DIAGNOSIS — Z765 Malingerer [conscious simulation]: Secondary | ICD-10-CM

## 2018-04-28 DIAGNOSIS — F17210 Nicotine dependence, cigarettes, uncomplicated: Secondary | ICD-10-CM | POA: Diagnosis present

## 2018-04-28 LAB — CBC WITH DIFFERENTIAL/PLATELET
BASOS ABS: 0 10*3/uL (ref 0.0–0.1)
BASOS PCT: 0 %
EOS ABS: 0.1 10*3/uL (ref 0.0–0.7)
EOS PCT: 1 %
HEMATOCRIT: 38.1 % — AB (ref 39.0–52.0)
HEMOGLOBIN: 12.9 g/dL — AB (ref 13.0–17.0)
LYMPHS PCT: 27 %
Lymphs Abs: 3.4 10*3/uL (ref 0.7–4.0)
MCH: 29.9 pg (ref 26.0–34.0)
MCHC: 33.9 g/dL (ref 30.0–36.0)
MCV: 88.2 fL (ref 78.0–100.0)
MONO ABS: 0.8 10*3/uL (ref 0.1–1.0)
Monocytes Relative: 6 %
Neutro Abs: 8.3 10*3/uL — ABNORMAL HIGH (ref 1.7–7.7)
Neutrophils Relative %: 66 %
Platelets: 429 10*3/uL — ABNORMAL HIGH (ref 150–400)
RBC: 4.32 MIL/uL (ref 4.22–5.81)
RDW: 12.4 % (ref 11.5–15.5)
WBC: 12.5 10*3/uL — AB (ref 4.0–10.5)

## 2018-04-28 LAB — RAPID URINE DRUG SCREEN, HOSP PERFORMED
Amphetamines: POSITIVE — AB
BENZODIAZEPINES: NOT DETECTED
Barbiturates: NOT DETECTED
COCAINE: NOT DETECTED
OPIATES: NOT DETECTED
TETRAHYDROCANNABINOL: NOT DETECTED

## 2018-04-28 LAB — URINALYSIS, ROUTINE W REFLEX MICROSCOPIC
BILIRUBIN URINE: NEGATIVE
Glucose, UA: NEGATIVE mg/dL
Hgb urine dipstick: NEGATIVE
Ketones, ur: NEGATIVE mg/dL
LEUKOCYTES UA: NEGATIVE
NITRITE: NEGATIVE
Protein, ur: NEGATIVE mg/dL
SPECIFIC GRAVITY, URINE: 1.011 (ref 1.005–1.030)
pH: 6 (ref 5.0–8.0)

## 2018-04-28 LAB — BASIC METABOLIC PANEL
ANION GAP: 11 (ref 5–15)
BUN: 7 mg/dL (ref 6–20)
CHLORIDE: 105 mmol/L (ref 101–111)
CO2: 22 mmol/L (ref 22–32)
Calcium: 9.3 mg/dL (ref 8.9–10.3)
Creatinine, Ser: 0.92 mg/dL (ref 0.61–1.24)
GFR calc Af Amer: >60 mL/min (ref >60)
Glucose, Bld: 117 mg/dL — ABNORMAL HIGH (ref 65–99)
POTASSIUM: 3.9 mmol/L (ref 3.5–5.1)
Sodium: 138 mmol/L (ref 135–145)

## 2018-04-28 LAB — I-STAT CG4 LACTIC ACID, ED: Lactic Acid, Venous: 2.12 mmol/L (ref 0.5–1.9)
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
## 2018-04-29 ENCOUNTER — Emergency Department (HOSPITAL_COMMUNITY): Payer: Medicaid Other

## 2018-04-29 DIAGNOSIS — A4101 Sepsis due to Methicillin susceptible Staphylococcus aureus: Secondary | ICD-10-CM | POA: Diagnosis present

## 2018-04-29 DIAGNOSIS — L03114 Cellulitis of left upper limb: Secondary | ICD-10-CM | POA: Diagnosis present

## 2018-04-29 DIAGNOSIS — L0390 Cellulitis, unspecified: Secondary | ICD-10-CM

## 2018-04-29 DIAGNOSIS — G8929 Other chronic pain: Secondary | ICD-10-CM | POA: Diagnosis present

## 2018-04-29 DIAGNOSIS — F1510 Other stimulant abuse, uncomplicated: Secondary | ICD-10-CM | POA: Diagnosis present

## 2018-04-29 DIAGNOSIS — L02414 Cutaneous abscess of left upper limb: Secondary | ICD-10-CM | POA: Diagnosis present

## 2018-04-29 DIAGNOSIS — M79622 Pain in left upper arm: Secondary | ICD-10-CM | POA: Diagnosis present

## 2018-04-29 DIAGNOSIS — Z91013 Allergy to seafood: Secondary | ICD-10-CM | POA: Diagnosis not present

## 2018-04-29 DIAGNOSIS — Z888 Allergy status to other drugs, medicaments and biological substances status: Secondary | ICD-10-CM | POA: Diagnosis not present

## 2018-04-29 DIAGNOSIS — E872 Acidosis: Secondary | ICD-10-CM | POA: Diagnosis present

## 2018-04-29 DIAGNOSIS — R0602 Shortness of breath: Secondary | ICD-10-CM | POA: Diagnosis present

## 2018-04-29 DIAGNOSIS — F17210 Nicotine dependence, cigarettes, uncomplicated: Secondary | ICD-10-CM | POA: Diagnosis present

## 2018-04-29 DIAGNOSIS — M5412 Radiculopathy, cervical region: Secondary | ICD-10-CM | POA: Diagnosis present

## 2018-04-29 DIAGNOSIS — A419 Sepsis, unspecified organism: Secondary | ICD-10-CM

## 2018-04-29 LAB — I-STAT CG4 LACTIC ACID, ED: Lactic Acid, Venous: 2.33 mmol/L (ref 0.5–1.9)

## 2018-04-29 MED ORDER — SODIUM CHLORIDE 0.9 % IV SOLN
INTRAVENOUS | Status: DC
  Administered 2018-04-29: 01:00:00 via INTRAVENOUS

## 2018-04-29 MED ORDER — PIPERACILLIN-TAZOBACTAM 3.375 G IVPB 30 MIN
3.3750 g | Freq: Once | INTRAVENOUS | Status: AC
  Administered 2018-04-29: 3.375 g via INTRAVENOUS
  Filled 2018-04-29: qty 50

## 2018-04-29 MED ORDER — ONDANSETRON HCL 4 MG/2ML IJ SOLN
4.0000 mg | Freq: Once | INTRAMUSCULAR | Status: AC
Start: 2018-04-29 — End: 2018-04-29
  Administered 2018-04-29: 4 mg via INTRAVENOUS
  Filled 2018-04-29: qty 2

## 2018-04-29 MED ORDER — LIDOCAINE-EPINEPHRINE (PF) 2 %-1:200000 IJ SOLN
20.0000 mL | Freq: Once | INTRAMUSCULAR | Status: AC
  Administered 2018-04-29: 20 mL via INTRADERMAL
  Filled 2018-04-29: qty 20

## 2018-04-29 MED ORDER — SODIUM CHLORIDE 0.9 % IV BOLUS
1000.0000 mL | Freq: Once | INTRAVENOUS | Status: AC
  Administered 2018-04-29: 1000 mL via INTRAVENOUS

## 2018-04-29 MED ORDER — VANCOMYCIN HCL IN DEXTROSE 1-5 GM/200ML-% IV SOLN
1000.0000 mg | Freq: Once | INTRAVENOUS | Status: AC
  Administered 2018-04-29: 1000 mg via INTRAVENOUS
  Filled 2018-04-29: qty 200

## 2018-04-29 MED ORDER — KETOROLAC TROMETHAMINE 30 MG/ML IJ SOLN
30.0000 mg | Freq: Once | INTRAMUSCULAR | Status: AC
  Administered 2018-04-29: 30 mg via INTRAVENOUS
  Filled 2018-04-29: qty 1

## 2018-04-29 NOTE — ED Provider Notes (Signed)
INCISION AND DRAINAGE Performed by: Roxy Horseman Consent: Verbal consent obtained. Risks and benefits: risks, benefits and alternatives were discussed Type: abscess  Body area: Left axilla  Anesthesia: local infiltration  Incision was made with a scalpel.  Local anesthetic: lidocaine 1% with epinephrine  Anesthetic total: 3 ml  Complexity: complex Blunt dissection to break up loculations  Drainage: purulent  Drainage amount: moderate  Packing material: 1/4 in iodoform gauze  Patient tolerance: Patient tolerated the procedure well with no immediate complications.      Roxy Horseman, PA-C 04/29/18 [phone]

## 2018-04-29 NOTE — ED Notes (Signed)
Pt has decided not to stay in the hospital at this time---- pulled out IV lines.  Pt was advised of leaving AMA.  EDP was made aware of pt's wanting to leave AMA.
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
## 2018-04-29 NOTE — ED Notes (Signed)
Writer notified EDP of abnormal I-stat lactic result 

## 2018-04-29 NOTE — ED Notes (Signed)
ED TO INPATIENT HANDOFF REPORT  Name/Age/Gender Albert Evans 47 y.o. male  Code Status Code Status History    Date Active Date Inactive Code Status Order ID Comments User Context   06/13/2017 0303 06/16/2017 1933 Full Code [id number]  Roseanne Kaufman, MD Inpatient   02/06/2012 0221 02/06/2012 2100 Full Code [id number]  Martie Lee, PA ED   01/24/2012 0155 01/29/2012 1703 Full Code [id number]  Orvan Falconer, MD ED      Home/SNF/Other Home  Chief Complaint Abcess, Emesis, Fever  Level of Care/Admitting Diagnosis ED Disposition    ED Disposition Condition [place] [facility]: [facility] [[id number]]  Level of Care: Med-Surg [16]  Diagnosis: Sepsis due to cellulitis [facility]) [[id number]]  Admitting Physician: Bennie Pierini [[id number]]  Attending Physician: Jonnie Finner, [place] [[id number]]  Estimated length of stay: past midnight tomorrow  Certification:: I certify this patient will need inpatient services for at least 2 midnights  PT Class (Do Not Modify): Inpatient [101]  PT Acc Code (Do Not Modify): Private [1]       Medical History Past Medical History:  Diagnosis Date  . Ruptured disc, cervical     Allergies Allergies  Allergen Reactions  . Fish-Derived Products Anaphylaxis and Rash  . Shellfish-Derived Products Anaphylaxis and Rash  . Flexeril [Cyclobenzaprine Hcl] Rash    Makes the patient lethargic also  . Robaxin [Methocarbamol] Rash    Makes the patient lethargic also    IV Location/Drains/Wounds Patient Lines/Drains/Airways Status   Active Line/Drains/Airways    Name:   Placement date:   Placement time:   Site:   Days:   Peripheral IV 06/13/17 Left Hand   06/13/17    1637    Hand   320   Peripheral IV 04/29/18 Right Forearm   04/29/18    0012    Forearm   less than 1   Incision (Closed) 06/13/17 Arm Right   06/13/17    1731     320   Wound / Incision (Open or Dehisced) 06/15/17 Other (Comment) Finger (Comment which one) Right infected  wound dorsum of R middle finger   06/15/17    1230    Finger (Comment which one)   318          Labs/Imaging Results for orders placed or performed during the hospital encounter of 04/28/18 (from the past 48 hour(s))  CBC with Differential     Status: Abnormal   Collection Time: 04/28/18 10:09 PM  Result Value Ref Range   WBC 12.5 (H) 4.0 - 10.5 K/uL   RBC 4.32 4.22 - 5.81 MIL/uL   Hemoglobin 12.9 (L) 13.0 - 17.0 g/dL   HCT 38.1 (L) 39.0 - 52.0 %   MCV 88.2 78.0 - 100.0 fL   MCH 29.9 26.0 - 34.0 pg   MCHC 33.9 30.0 - 36.0 g/dL   RDW 12.4 11.5 - 15.5 %   Platelets 429 (H) 150 - 400 K/uL   Neutrophils Relative % 66 %   Neutro Abs 8.3 (H) 1.7 - 7.7 K/uL   Lymphocytes Relative 27 %   Lymphs Abs 3.4 0.7 - 4.0 K/uL   Monocytes Relative 6 %   Monocytes Absolute 0.8 0.1 - 1.0 K/uL   Eosinophils Relative 1 %   Eosinophils Absolute 0.1 0.0 - 0.7 K/uL   Basophils Relative 0 %   Basophils Absolute 0.0 0.0 - 0.1 K/uL    Comment: Performed at [facility], [address] [place], [place] [address]  Basic metabolic panel     Status: Abnormal   Collection Time: 04/28/18 10:09 PM  Result Value Ref Range   Sodium 138 135 - 145 mmol/L   Potassium 3.9 3.5 - 5.1 mmol/L   Chloride 105 101 - 111 mmol/L   CO2 22 22 - 32 mmol/L   Glucose, Bld 117 (H) 65 - 99 mg/dL   BUN 7 6 - 20 mg/dL   Creatinine, Ser 0.92 0.61 - 1.24 mg/dL   Calcium 9.3 8.9 - 10.3 mg/dL   GFR calc non Af Amer >60 >60 mL/min   GFR calc Af Amer >60 >60 mL/min    Comment: (NOTE) The eGFR has been calculated using the CKD EPI equation. This calculation has not been validated in all clinical situations. eGFR's persistently <60 mL/min signify possible Chronic Kidney Disease.    Anion gap 11 5 - 15    Comment: Performed at [facility], [place] [address], [place], [place] [address]  I-Stat CG4 Lactic Acid, ED     Status: Abnormal   Collection Time: 04/28/18 10:18 PM  Result Value Ref Range    Lactic Acid, Venous 2.12 (HH) 0.5 - 1.9 mmol/L   Comment NOTIFIED PHYSICIAN   Urinalysis, Routine w reflex microscopic     Status: None   Collection Time: 04/28/18 10:39 PM  Result Value Ref Range   Color, Urine YELLOW YELLOW   APPearance CLEAR CLEAR   Specific Gravity, Urine 1.011 1.005 - 1.030   pH 6.0 5.0 - 8.0   Glucose, UA NEGATIVE NEGATIVE mg/dL   Hgb urine dipstick NEGATIVE NEGATIVE   Bilirubin Urine NEGATIVE NEGATIVE   Ketones, ur NEGATIVE NEGATIVE mg/dL   Protein, ur NEGATIVE NEGATIVE mg/dL   Nitrite NEGATIVE NEGATIVE   Leukocytes, UA NEGATIVE NEGATIVE    Comment: Performed at [facility] [address], [place], [place] [address]  Rapid urine drug screen (hospital performed)     Status: Abnormal   Collection Time: 04/28/18 10:39 PM  Result Value Ref Range   Opiates NONE DETECTED NONE DETECTED   Cocaine NONE DETECTED NONE DETECTED   Benzodiazepines NONE DETECTED NONE DETECTED   Amphetamines POSITIVE (A) NONE DETECTED   Tetrahydrocannabinol NONE DETECTED NONE DETECTED   Barbiturates NONE DETECTED NONE DETECTED    Comment: (NOTE) DRUG SCREEN FOR MEDICAL PURPOSES ONLY.  IF CONFIRMATION IS NEEDED FOR ANY PURPOSE, NOTIFY LAB WITHIN 5 DAYS. LOWEST DETECTABLE LIMITS FOR URINE DRUG SCREEN Drug Class                     Cutoff (ng/mL) Amphetamine and metabolites    1000 Barbiturate and metabolites    200 Benzodiazepine                 664 Tricyclics and metabolites     300 Opiates and metabolites        300 Cocaine and metabolites        300 THC                            50 Performed at [facility], [place] [address], [place],  [address]   I-Stat CG4 Lactic Acid, ED     Status: Abnormal   Collection Time: 04/29/18 12:32 AM  Result Value Ref Range   Lactic Acid, Venous 2.33 (HH) 0.5 - 1.9 mmol/L   Comment NOTIFIED PHYSICIAN    Dg Chest 2  View  Result Date: 04/29/2018 CLINICAL DATA:  Short of breath EXAM: CHEST - 2  VIEW COMPARISON:  None. FINDINGS: Postsurgical changes in the cervical spine. No acute opacity or pleural effusion. Normal heart size. No pneumothorax. Symmetrical lower lung nodular opacity consistent with nipple shadow IMPRESSION: No active cardiopulmonary disease. Electronically Signed   By: Donavan Foil M.D.   On: 04/29/2018 00:34    Pending Labs Unresulted Labs (From admission, onward)   Start     Ordered   04/29/18 0008  Wound or Superficial Culture  STAT,   STAT     04/29/18 0007   04/28/18 2358  Blood culture (routine x 2)  BLOOD CULTURE X 2,   STAT     04/28/18 2357   Signed and Held  HIV antibody (Routine Testing)  Once,   R     Signed and Held   Signed and Held  Magnesium  Add-on,   R     Signed and Held   Signed and Held  Phosphorus  Add-on,   R     Signed and Held   Signed and Occupational hygienist morning,   R     Signed and Held   Signed and Held  CBC  Tomorrow morning,   R     Signed and Held   Signed and Held  Hepatitis panel, acute  Once,   R     Signed and Held   Signed and Held  Ethanol  Add-on,   R     Signed and Held      Vitals/Pain Today's Vitals   04/28/18 2158 04/28/18 2159 04/28/18 2353  BP: 126/81    Pulse: 80    Temp: 98.5 F (36.9 C)    TempSrc: Oral    SpO2: 99%    PainSc:  10-Worst pain ever 10-Worst pain ever    Isolation Precautions No active isolations  Medications Medications  0.9 %  sodium chloride infusion ( Intravenous New Bag/Given 04/29/18 0033)  sodium chloride 0.9 % bolus 1,000 mL (0 mLs Intravenous Stopped 04/29/18 0134)  lidocaine-EPINEPHrine (XYLOCAINE W/EPI) 2 %-1:200000 (PF) injection 20 mL (20 mLs Intradermal Given 04/29/18 0033)  ketorolac (TORADOL) 30 MG/ML injection 30 mg (30 mg Intravenous Given 04/29/18 0027)  ondansetron (ZOFRAN) injection 4 mg (4 mg Intravenous Given 04/29/18 0027)  vancomycin (VANCOCIN) IVPB 1000 mg/200 mL premix (0 mg Intravenous Stopped 04/29/18 0134)  piperacillin-tazobactam  (ZOSYN) IVPB 3.375 g (0 g Intravenous Stopped 04/29/18 0134)  sodium chloride 0.9 % bolus 1,000 mL (0 mLs Intravenous Stopped 04/29/18 0134)    Mobility walks

## 2018-04-29 NOTE — ED Notes (Signed)
EKG given to EDP,Little,MD. For review. 

## 2018-04-29 NOTE — H&P (Signed)
History and Physical    JARELL MCEWEN NWG:[id number] DOB: 1971/04/30 DOA: 04/28/2018  PCP: Patient, No Pcp Per Patient coming from: Home  I have personally briefly reviewed patient's old medical records in [facility]  Chief Complaint: Abscess  HPI: ARUL FARABEE is a 47 y.o. male with medical history significant for chronic pain 2/2 cervical radiculopathy and polysubstance abuse who present to the ED with an abscess in his left antecubital fossa and reports of fever after a friend injected him with an unspecified powder over a week ago. He reports that he failed a drug test approximately 1 month ago, and his primary care provider stopped prescribing narcotics, so he was using any means necessary for pain relief. The friend tried the right AC first, but was unsuccessful, than the left AC. He was injected in the left [facility] but states that he doubts the substance got into his bloodstream. He reports pain at both sites with worsening swelling and redness since that time. Denies frank fever, but endorses fatigue, malaise and shortness of breath. Denies cough, nausea, vomiting, diarrhea, dysuria, rash.  ED Course: In the ED, pt afebrile and hemodynamically stable. Labs notable for WBC 12.5, platelets 429, normal electrolytes and renal function. Lactate elevated at 2.33. U/A unremarkable. UDS positive for amphetamines. CXR negative. I&D performed in ED. Vanc, Zosyn and IV fluid given prior to transfer to the floor.  Review of Systems: As per HPI otherwise 10 point review of systems negative.   Past Medical History:  Diagnosis Date  . Ruptured disc, cervical     Past Surgical History:  Procedure Laterality Date  . I&D EXTREMITY Right 06/13/2017   Procedure: IRRIGATION AND DEBRIDEMENT RIGHT HAND;  Surgeon: Dominica Severin, MD;  Location: [facility] OR;  Service: Orthopedics;  Laterality: Right;  . IRRIGATION AND DEBRIDEMENT ABSCESS  01/25/2012   Procedure: IRRIGATION AND DEBRIDEMENT ABSCESS;  Surgeon:  Lindaann Slough, MD;  Location: [facility] OR;  Service: Urology;  Laterality: N/A;  . NECK SURGERY       reports that he has been smoking cigarettes.  He has a 2.50 pack-year smoking history. He has never used smokeless tobacco. He reports that he drinks alcohol. He reports that he has current or past drug history. Drug: Marijuana.  Allergies  Allergen Reactions  . Fish-Derived Products Anaphylaxis and Rash  . Shellfish-Derived Products Anaphylaxis and Rash  . Flexeril [Cyclobenzaprine Hcl] Rash    Makes the patient lethargic also  . Robaxin [Methocarbamol] Rash    Makes the patient lethargic also    Family history reviewed. No pertinent findings.  Prior to Admission medications   Medication Sig Start Date End Date Taking? Authorizing Provider  ibuprofen (ADVIL,MOTRIN) 200 MG tablet Take 800-1,000 mg by mouth every 6 (six) hours as needed (for neck pain).    [provider]  pregabalin (LYRICA) 150 MG capsule Take 150 mg by mouth 2 (two) times daily.    [provider]    Physical Exam: Vitals:   04/28/18 2158  BP: 126/81  Pulse: 80  Temp: 98.5 F (36.9 C)  TempSrc: Oral  SpO2: 99%    Constitutional: NAD, calm, comfortable Eyes: PERRL, lids and conjunctivae normal ENMT: Mucous membranes are moist. Posterior pharynx clear of any exudate or lesions. Neck: normal, supple, no masses Respiratory: clear to auscultation bilaterally, no wheezing, no crackles. Normal respiratory effort. Cardiovascular: Regular rate and rhythm, no murmurs / rubs / gallops. No extremity edema. 2+ pedal pulses.  Abdomen: periumbilical TTP. Bowel sounds positive.  Musculoskeletal: no clubbing / cyanosis. No joint deformity upper and lower extremities. Good ROM, no contractures. Normal muscle tone.  Skin: redness with palpable cord on right AC, s/p I&D of left arm abscess, no stigmata of endocarditis noted Neurologic: CN 2-12 grossly intact. Sensation intact, DTR normal. Strength 5/5 in all  4.  Psychiatric: Alert and oriented x 3. Normal mood.   Labs on Admission: I have personally reviewed following labs and imaging studies  CBC: Recent Labs  Lab 04/28/18 2209  WBC 12.5*  NEUTROABS 8.3*  HGB 12.9*  HCT 38.1*  MCV 88.2  PLT 429*   Basic Metabolic Panel: Recent Labs  Lab 04/28/18 2209  NA 138  K 3.9  CL 105  CO2 22  GLUCOSE 117*  BUN 7  CREATININE 0.92  CALCIUM 9.3   GFR: CrCl cannot be calculated (Unknown ideal weight.). Liver Function Tests: No results for input(s): AST, ALT, ALKPHOS, BILITOT, PROT, ALBUMIN in the last 168 hours. No results for input(s): LIPASE, AMYLASE in the last 168 hours. No results for input(s): AMMONIA in the last 168 hours. Coagulation Profile: No results for input(s): INR, PROTIME in the last 168 hours. Cardiac Enzymes: No results for input(s): CKTOTAL, CKMB, CKMBINDEX, TROPONINI in the last 168 hours. BNP (last 3 results) No results for input(s): PROBNP in the last 8760 hours. HbA1C: No results for input(s): HGBA1C in the last 72 hours. CBG: No results for input(s): GLUCAP in the last 168 hours. Lipid Profile: No results for input(s): CHOL, HDL, LDLCALC, TRIG, CHOLHDL, LDLDIRECT in the last 72 hours. Thyroid Function Tests: No results for input(s): TSH, T4TOTAL, FREET4, T3FREE, THYROIDAB in the last 72 hours. Anemia Panel: No results for input(s): VITAMINB12, FOLATE, FERRITIN, TIBC, IRON, RETICCTPCT in the last 72 hours. Urine analysis:    Component Value Date/Time   COLORURINE YELLOW 04/28/2018 2239   APPEARANCEUR CLEAR 04/28/2018 2239   LABSPEC 1.011 04/28/2018 2239   PHURINE 6.0 04/28/2018 2239   GLUCOSEU NEGATIVE 04/28/2018 2239   HGBUR NEGATIVE 04/28/2018 2239   BILIRUBINUR NEGATIVE 04/28/2018 2239   KETONESUR NEGATIVE 04/28/2018 2239   PROTEINUR NEGATIVE 04/28/2018 2239   UROBILINOGEN 0.2 01/23/2012 2202   NITRITE NEGATIVE 04/28/2018 2239   LEUKOCYTESUR NEGATIVE 04/28/2018 2239    Radiological Exams on  Admission: Dg Chest 2 View  Result Date: 04/29/2018 CLINICAL DATA:  Short of breath EXAM: CHEST - 2 VIEW COMPARISON:  None. FINDINGS: Postsurgical changes in the cervical spine. No acute opacity or pleural effusion. Normal heart size. No pneumothorax. Symmetrical lower lung nodular opacity consistent with nipple shadow IMPRESSION: No active cardiopulmonary disease. Electronically Signed   By: Jasmine Pang M.D.   On: 04/29/2018 00:34    EKG: Independently reviewed. Sinus rhythm. No ST-T changes.  Assessment/Plan Active Problems:   Sepsis due to cellulitis (HCC)   Sepsis, abscess with overlying cellulitis of left AC Lactic acidosis - Continue vanc, Zosyn; Pharmacy managed - Follow up blood and wound cultures - Obtain TTE to r/o IE - Continue hydrating patient - Trend lactate - Trend fever curve - Pain control PRN - Daily CBC - Engage SW for addiction services - Monitor for withdrawal symptoms  DVT prophylaxis: Lovenox Code Status: Full Disposition Plan: Home in 2-3 days Admission status: Inpatient   Marcelo Baldy MD [facility]  If 7PM-7AM, please contact night-coverage [email or web address] Password [facility]  04/29/2018, 12:54 AM

## 2018-05-01 LAB — AEROBIC CULTURE  (SUPERFICIAL SPECIMEN)

## 2018-05-01 LAB — AEROBIC CULTURE W GRAM STAIN (SUPERFICIAL SPECIMEN): Gram Stain: NONE SEEN

## 2018-05-02 ENCOUNTER — Telehealth: Payer: Self-pay

## 2018-05-02 NOTE — Telephone Encounter (Signed)
Post ED Visit - Positive Culture Follow-up: Unsuccessful Patient Follow-up  Culture assessed and recommendations reviewed by:   Enzo Bi, Pharm.D.  Celedonio Miyamoto, Pharm.D., BCPS AQ-ID  Garvin Fila, Pharm.D., BCPS  Georgina Pillion, Pharm.D., BCPS  [place], [address].D., BCPS, AAHIVP  Estella Husk, Pharm.D., BCPS, AAHIVP  Sherlynn Carbon, PharmD  Pollyann Samples, PharmD, BCPS Sharin Mons Pharm D Positive aerobic culture   Patient discharged without antimicrobial prescription and treatment is now indicated  Organism is resistant to prescribed ED discharge antimicrobial  Patient with positive blood cultures   Unable to contact patient after 3 attempts, letter will be sent to address on file  Jerry Caras 05/02/2018, 9:59 AM

## 2018-05-02 NOTE — Progress Notes (Signed)
ED Antimicrobial Stewardship Positive Culture Follow Up   Albert Evans is an 47 y.o. male who presented to [facility] on 04/28/2018 with a chief complaint of  Chief Complaint  Patient presents with  . Abscess    left AC    Recent Results (from the past 720 hour(s))  Blood culture (routine x 2)     Status: None (Preliminary result)   Collection Time: 04/29/18 12:07 AM  Result Value Ref Range Status   Specimen Description   Final    BLOOD LEFT FOREARM Performed at [facility], [address] [place], [place] [address]    Special Requests   Final    AEROBIC BOTTLE ONLY Blood Culture adequate volume Performed at [facility], [address], [place], [place] [address]    Culture   Final    NO GROWTH 2 DAYS Performed at [facility], [address], [place], [place] [address]    Report Status PENDING  Incomplete  Blood culture (routine x 2)     Status: None (Preliminary result)   Collection Time: 04/29/18 12:14 AM  Result Value Ref Range Status   Specimen Description   Final    BLOOD RIGHT FOREARM Performed at [facility], [address] [place], [place] [address]    Special Requests   Final    AEROBIC BOTTLE ONLY Blood Culture adequate volume Performed at [facility], [address] [place], [place] [address]    Culture   Final    NO GROWTH 2 DAYS Performed at [facility], [address] [place], [place] [address]    Report Status PENDING  Incomplete  Wound or Superficial Culture     Status: None   Collection Time: 04/29/18 12:14 AM  Result Value Ref Range Status   Specimen Description   Final    WOUND LEFT AC Performed at [facility], [address] [place], [place] [address]    Special Requests   Final    NONE Performed at [facility], [address] [place], [place] [address]    Gram Stain   Final    NO WBC SEEN NO ORGANISMS  SEEN Performed at [facility], [address], [place], [place] [address]    Culture FEW STAPHYLOCOCCUS AUREUS  Final   Report Status 05/01/2018 FINAL  Final   Organism ID, Bacteria STAPHYLOCOCCUS AUREUS  Final      Susceptibility   Staphylococcus aureus - MIC*    CIPROFLOXACIN <=0.5 SENSITIVE Sensitive     ERYTHROMYCIN <=0.25 SENSITIVE Sensitive     GENTAMICIN <=0.5 SENSITIVE Sensitive     OXACILLIN <=0.25 SENSITIVE Sensitive     TETRACYCLINE <=1 SENSITIVE Sensitive     VANCOMYCIN <=0.5 SENSITIVE Sensitive     TRIMETH/SULFA <=10 SENSITIVE Sensitive     CLINDAMYCIN <=0.25 SENSITIVE Sensitive     RIFAMPIN <=0.5 SENSITIVE Sensitive     Inducible Clindamycin NEGATIVE Sensitive     * FEW STAPHYLOCOCCUS AUREUS      Patient discharged originally without antimicrobial agent and treatment is now indicated  New antibiotic prescription:  Cephalexin 500 mg BID X 7 days Return to ED if arm worsens   ED Provider: [name] Hart,  PA-C   Della Goo 05/02/2018, 9:30 AM Infectious Diseases Pharmacy Resident  Phone# [phone]

## 2018-05-04 LAB — CULTURE, BLOOD (ROUTINE X 2)
CULTURE: NO GROWTH
CULTURE: NO GROWTH
SPECIAL REQUESTS: ADEQUATE
Special Requests: ADEQUATE

## 2018-05-08 NOTE — Telephone Encounter (Signed)
PT retruned call from letter sent to home:  Cephalexin 500 mg BID x 7 days call to [facility] [place] [phone]  Written by [name] Hart PA-C

## 2018-08-15 DEATH — deceased

## 2018-11-22 IMAGING — CR DG CHEST 2V
2 series · 2 of 2 positions shown · non-contrast
Comparison: None.

CLINICAL DATA: Short of breath

EXAM:
CHEST - 2 VIEW

[w chest pa]
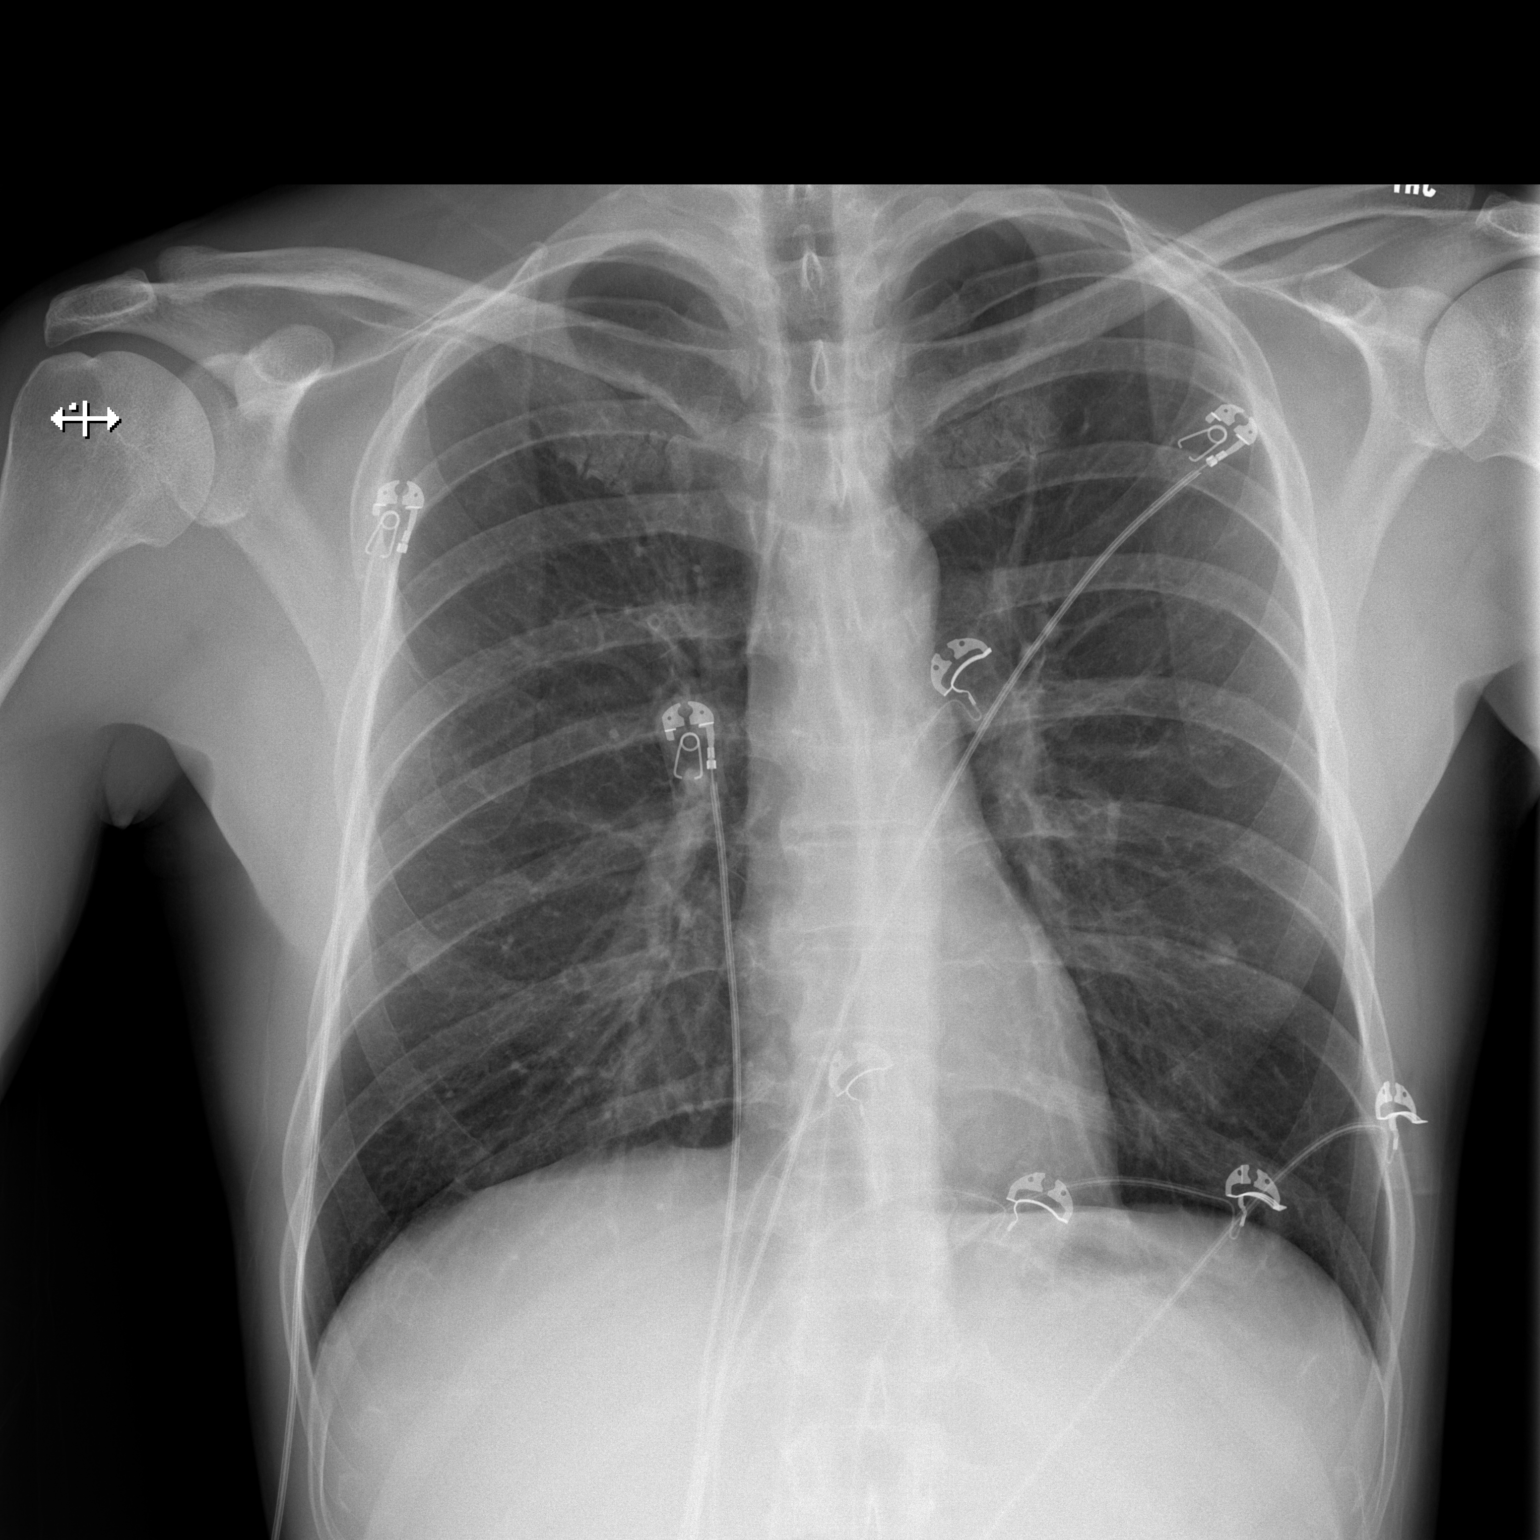

[w chest lat]
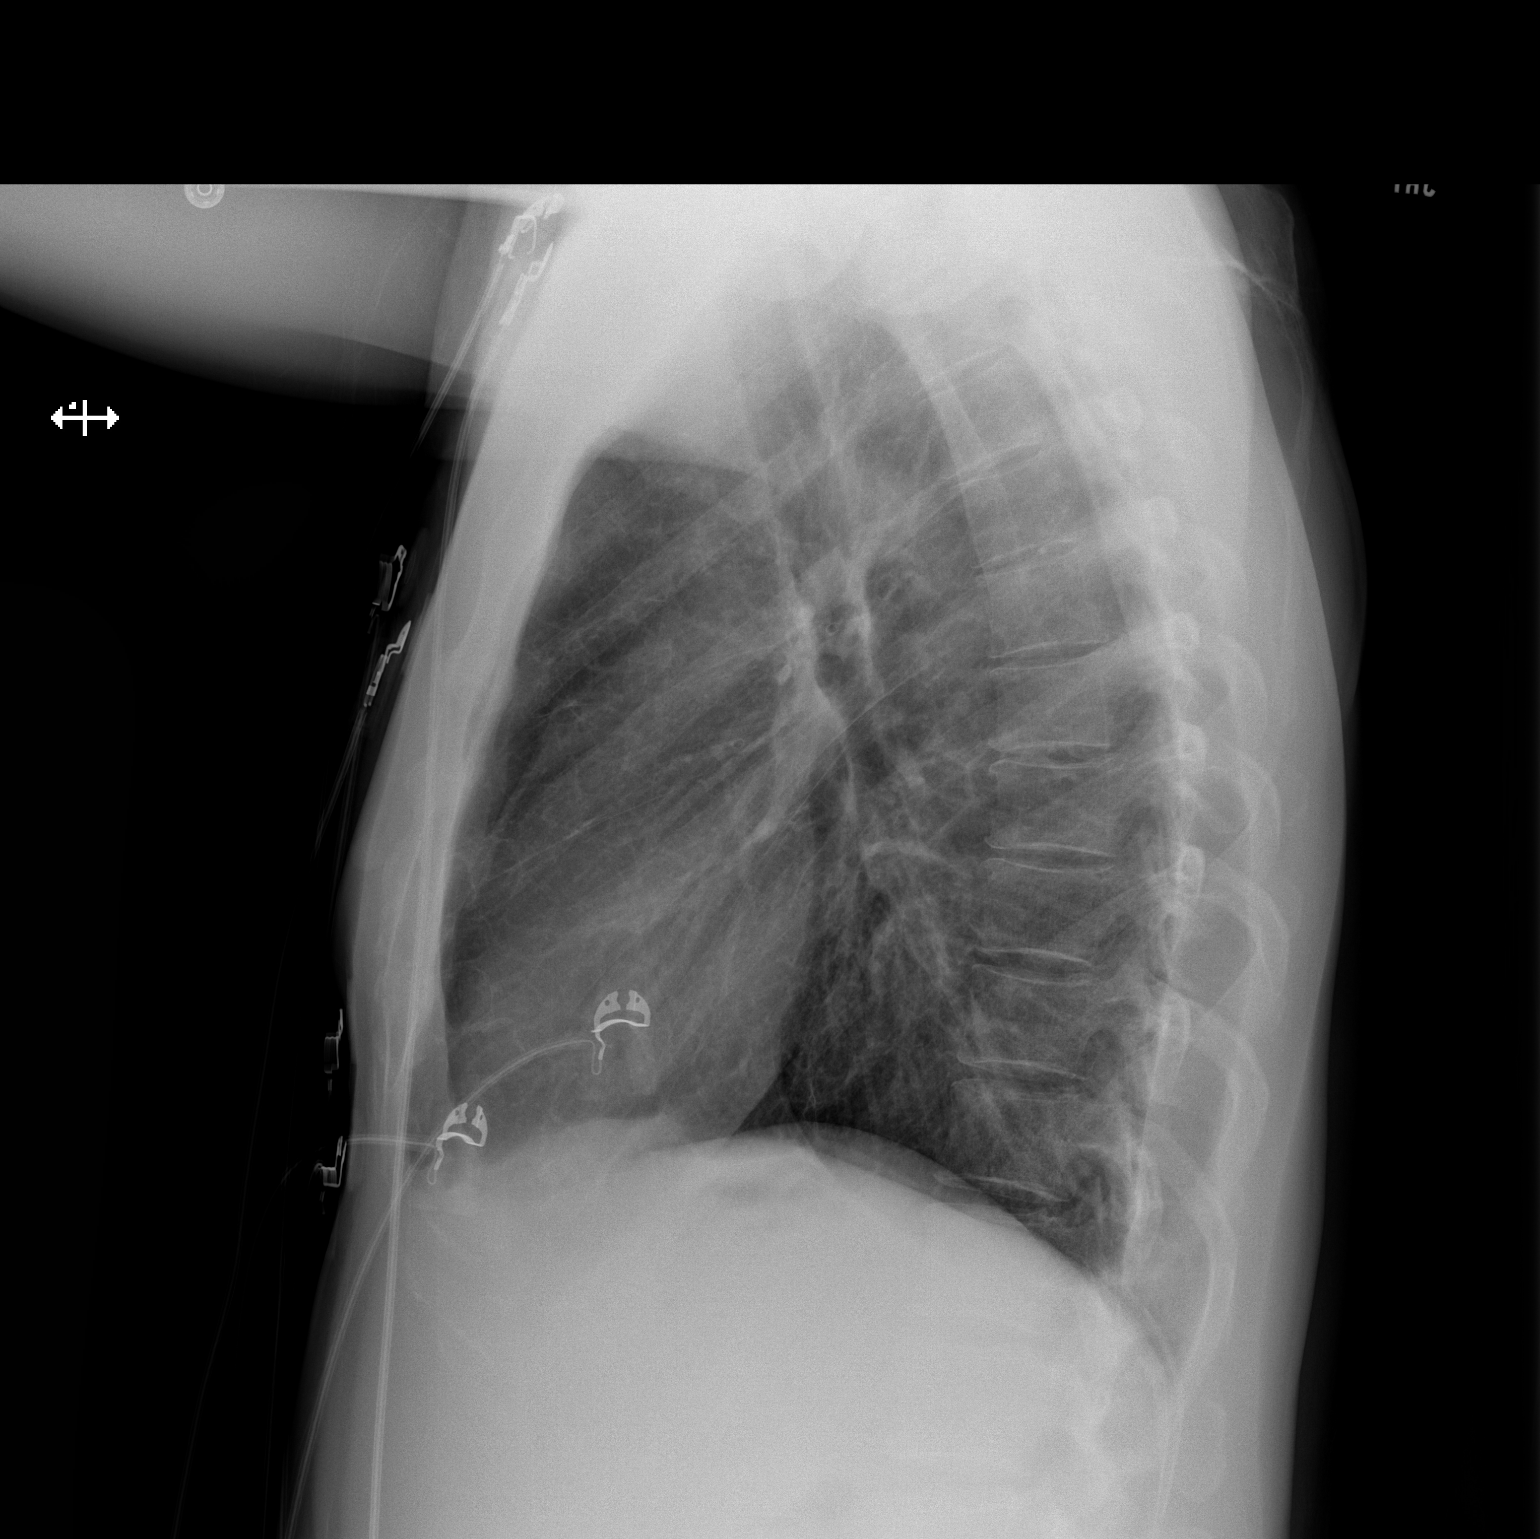

[2 of 2 positions shown; findings below may reference images not displayed]

FINDINGS: Postsurgical changes in the cervical spine. No acute opacity or
pleural effusion. Normal heart size. No pneumothorax. Symmetrical
lower lung nodular opacity consistent with nipple shadow
IMPRESSION: No active cardiopulmonary disease.
